# Patient Record
Sex: Female | Born: 1950
Health system: Southern US, Community
[De-identification: ages and names within clinical notes are randomized; demographics above are authoritative.]

## PROBLEM LIST (undated history)

## (undated) DIAGNOSIS — E785 Hyperlipidemia, unspecified: Secondary | ICD-10-CM

## (undated) DIAGNOSIS — T7840XA Allergy, unspecified, initial encounter: Secondary | ICD-10-CM

## (undated) DIAGNOSIS — J45909 Unspecified asthma, uncomplicated: Secondary | ICD-10-CM

## (undated) DIAGNOSIS — M1990 Unspecified osteoarthritis, unspecified site: Secondary | ICD-10-CM

## (undated) DIAGNOSIS — H269 Unspecified cataract: Secondary | ICD-10-CM

## (undated) DIAGNOSIS — K219 Gastro-esophageal reflux disease without esophagitis: Secondary | ICD-10-CM

## (undated) DIAGNOSIS — D649 Anemia, unspecified: Secondary | ICD-10-CM

## (undated) HISTORY — DX: Hyperlipidemia, unspecified: E78.5

## (undated) HISTORY — PX: OTHER SURGICAL HISTORY: SHX169

## (undated) HISTORY — PX: EYE SURGERY: SHX253

## (undated) HISTORY — DX: Allergy, unspecified, initial encounter: T78.40XA

## (undated) HISTORY — DX: Gastro-esophageal reflux disease without esophagitis: K21.9

## (undated) HISTORY — PX: TUBAL LIGATION: SHX77

## (undated) HISTORY — DX: Unspecified cataract: H26.9

## (undated) HISTORY — PX: FRACTURE SURGERY: SHX138

## (undated) HISTORY — DX: Unspecified osteoarthritis, unspecified site: M19.90

## (undated) HISTORY — PX: POLYPECTOMY: SHX149

## (undated) HISTORY — DX: Anemia, unspecified: D64.9

## (undated) HISTORY — DX: Unspecified asthma, uncomplicated: J45.909

## (undated) HISTORY — PX: ABDOMINAL HYSTERECTOMY: SHX81

## (undated) HISTORY — PX: COLONOSCOPY: SHX174

---

## 2001-01-08 ENCOUNTER — Encounter: Admission: RE | Admit: 2001-01-08 | Discharge: 2001-01-08 | Payer: Self-pay | Admitting: Gynecology

## 2001-01-08 ENCOUNTER — Encounter: Payer: Self-pay | Admitting: Gynecology

## 2001-01-10 ENCOUNTER — Other Ambulatory Visit: Admission: RE | Admit: 2001-01-10 | Discharge: 2001-01-10 | Payer: Self-pay | Admitting: Gynecology

## 2002-01-21 ENCOUNTER — Encounter: Payer: Self-pay | Admitting: Gynecology

## 2002-01-21 ENCOUNTER — Encounter: Admission: RE | Admit: 2002-01-21 | Discharge: 2002-01-21 | Payer: Self-pay | Admitting: Gynecology

## 2002-02-17 ENCOUNTER — Other Ambulatory Visit: Admission: RE | Admit: 2002-02-17 | Discharge: 2002-02-17 | Payer: Self-pay | Admitting: Gynecology

## 2003-02-19 ENCOUNTER — Other Ambulatory Visit: Admission: RE | Admit: 2003-02-19 | Discharge: 2003-02-19 | Payer: Self-pay | Admitting: Gynecology

## 2003-02-19 ENCOUNTER — Encounter: Payer: Self-pay | Admitting: Gynecology

## 2003-02-19 ENCOUNTER — Encounter: Admission: RE | Admit: 2003-02-19 | Discharge: 2003-02-19 | Payer: Self-pay | Admitting: Gynecology

## 2003-11-12 ENCOUNTER — Emergency Department (HOSPITAL_COMMUNITY): Admission: EM | Admit: 2003-11-12 | Discharge: 2003-11-13 | Payer: Self-pay | Admitting: Emergency Medicine

## 2004-02-29 ENCOUNTER — Encounter: Admission: RE | Admit: 2004-02-29 | Discharge: 2004-02-29 | Payer: Self-pay | Admitting: Gynecology

## 2004-02-29 ENCOUNTER — Other Ambulatory Visit: Admission: RE | Admit: 2004-02-29 | Discharge: 2004-02-29 | Payer: Self-pay | Admitting: Gynecology

## 2004-06-13 ENCOUNTER — Ambulatory Visit: Payer: Self-pay | Admitting: Family Medicine

## 2004-08-03 ENCOUNTER — Ambulatory Visit: Payer: Self-pay | Admitting: Family Medicine

## 2004-08-23 ENCOUNTER — Ambulatory Visit: Payer: Self-pay | Admitting: Family Medicine

## 2005-02-28 ENCOUNTER — Ambulatory Visit: Payer: Self-pay | Admitting: Internal Medicine

## 2005-03-01 ENCOUNTER — Other Ambulatory Visit: Admission: RE | Admit: 2005-03-01 | Discharge: 2005-03-01 | Payer: Self-pay | Admitting: Gynecology

## 2005-03-09 ENCOUNTER — Encounter: Admission: RE | Admit: 2005-03-09 | Discharge: 2005-03-09 | Payer: Self-pay | Admitting: Gynecology

## 2005-04-04 ENCOUNTER — Ambulatory Visit: Payer: Self-pay | Admitting: Internal Medicine

## 2005-06-07 ENCOUNTER — Ambulatory Visit: Payer: Self-pay | Admitting: Family Medicine

## 2006-03-05 ENCOUNTER — Other Ambulatory Visit: Admission: RE | Admit: 2006-03-05 | Discharge: 2006-03-05 | Payer: Self-pay | Admitting: Gynecology

## 2006-04-17 ENCOUNTER — Encounter: Admission: RE | Admit: 2006-04-17 | Discharge: 2006-04-17 | Payer: Self-pay | Admitting: Gynecology

## 2006-04-24 ENCOUNTER — Encounter: Admission: RE | Admit: 2006-04-24 | Discharge: 2006-04-24 | Payer: Self-pay | Admitting: Gynecology

## 2006-07-02 ENCOUNTER — Ambulatory Visit: Payer: Self-pay | Admitting: Family Medicine

## 2007-06-06 ENCOUNTER — Ambulatory Visit: Payer: Self-pay | Admitting: Family Medicine

## 2007-06-14 ENCOUNTER — Ambulatory Visit: Payer: Self-pay | Admitting: Family Medicine

## 2007-06-27 ENCOUNTER — Telehealth (INDEPENDENT_AMBULATORY_CARE_PROVIDER_SITE_OTHER): Payer: Self-pay | Admitting: *Deleted

## 2007-07-02 ENCOUNTER — Other Ambulatory Visit: Admission: RE | Admit: 2007-07-02 | Discharge: 2007-07-02 | Payer: Self-pay | Admitting: Gynecology

## 2007-07-02 ENCOUNTER — Encounter: Payer: Self-pay | Admitting: Family Medicine

## 2007-07-08 ENCOUNTER — Encounter: Payer: Self-pay | Admitting: Family Medicine

## 2007-07-10 ENCOUNTER — Encounter: Payer: Self-pay | Admitting: Family Medicine

## 2007-07-10 ENCOUNTER — Encounter: Admission: RE | Admit: 2007-07-10 | Discharge: 2007-07-10 | Payer: Self-pay | Admitting: Gynecology

## 2007-07-10 DIAGNOSIS — M899 Disorder of bone, unspecified: Secondary | ICD-10-CM | POA: Insufficient documentation

## 2007-07-10 DIAGNOSIS — M949 Disorder of cartilage, unspecified: Secondary | ICD-10-CM

## 2007-07-23 ENCOUNTER — Encounter: Admission: RE | Admit: 2007-07-23 | Discharge: 2007-07-23 | Payer: Self-pay | Admitting: Gynecology

## 2008-01-06 ENCOUNTER — Encounter: Payer: Self-pay | Admitting: Family Medicine

## 2008-05-08 ENCOUNTER — Encounter: Payer: Self-pay | Admitting: Family Medicine

## 2008-11-09 ENCOUNTER — Encounter: Payer: Self-pay | Admitting: Family Medicine

## 2009-01-12 ENCOUNTER — Encounter: Admission: RE | Admit: 2009-01-12 | Discharge: 2009-01-12 | Payer: Self-pay | Admitting: Gynecology

## 2009-01-15 ENCOUNTER — Encounter: Payer: Self-pay | Admitting: Family Medicine

## 2009-06-29 ENCOUNTER — Encounter: Payer: Self-pay | Admitting: Family Medicine

## 2009-10-27 ENCOUNTER — Encounter: Payer: Self-pay | Admitting: Family Medicine

## 2010-02-09 ENCOUNTER — Encounter (INDEPENDENT_AMBULATORY_CARE_PROVIDER_SITE_OTHER): Payer: Self-pay | Admitting: *Deleted

## 2010-03-24 ENCOUNTER — Encounter: Payer: Self-pay | Admitting: Family Medicine

## 2010-06-08 ENCOUNTER — Encounter: Payer: Self-pay | Admitting: Family Medicine

## 2010-08-28 ENCOUNTER — Encounter: Payer: Self-pay | Admitting: Gynecology

## 2010-09-06 NOTE — Letter (Signed)
Summary: Colonoscopy Letter   Gastroenterology  [address] [place], [place] [address]   Phone: [phone]  Fax: [phone]      February 09, 2010 MRN: [id number]   Melinda Tran [address] [place], [place]  [address]   Dear Melinda Tran,   According to your medical record, it is time for you to schedule a Colonoscopy. The American Cancer Society recommends this procedure as a method to detect early colon cancer. Patients with a family history of colon cancer, or a personal history of colon polyps or inflammatory bowel disease are at increased risk.  This letter has beeen generated based on the recommendations made at the time of your procedure. If you feel that in your particular situation this may no longer apply, please contact our office.  Please call our office at [phone] to schedule this appointment or to update your records at your earliest convenience.  Thank you for cooperating with [place] to provide you with the very best care possible.   Sincerely,  Hedwig Morton. Juanda Chance, M.D.  [facility] Gastroenterology Division [phone]

## 2010-09-06 NOTE — Consult Note (Signed)
Summary: Dr.Charles Lomax  Dr.Charles Lomax   Imported By: Beau Fanny 03/29/2010 08:51:50  _____________________________________________________________________  External Attachment:    Type:   Image     Comment:   External Document

## 2010-09-06 NOTE — Letter (Signed)
Summary: [facility]  [facility]   Imported By: Maryln Gottron 11/10/2009 14:21:05  _____________________________________________________________________  External Attachment:    Type:   Image     Comment:   External Document

## 2010-09-06 NOTE — Letter (Signed)
Summary: [facility]  [facility]   Imported By: Lanelle Bal 06/24/2010 14:12:35  _____________________________________________________________________  External Attachment:    Type:   Image     Comment:   External Document

## 2012-03-25 ENCOUNTER — Telehealth: Payer: Self-pay | Admitting: Internal Medicine

## 2012-03-25 ENCOUNTER — Encounter: Payer: Self-pay | Admitting: Internal Medicine

## 2012-03-25 NOTE — Telephone Encounter (Signed)
Recall Project: no contact with pt, letter mailed °

## 2014-01-28 ENCOUNTER — Other Ambulatory Visit: Payer: Self-pay

## 2014-01-28 DIAGNOSIS — Z1231 Encounter for screening mammogram for malignant neoplasm of breast: Secondary | ICD-10-CM

## 2014-02-05 ENCOUNTER — Ambulatory Visit
Admission: RE | Admit: 2014-02-05 | Discharge: 2014-02-05 | Disposition: A | Discharge status: Home / Self Care | Payer: BC Managed Care – PPO | Source: Ambulatory Visit

## 2014-02-05 ENCOUNTER — Encounter (INDEPENDENT_AMBULATORY_CARE_PROVIDER_SITE_OTHER): Payer: Self-pay

## 2014-02-05 DIAGNOSIS — Z1231 Encounter for screening mammogram for malignant neoplasm of breast: Secondary | ICD-10-CM

## 2014-02-05 IMAGING — MG MM SCREENING BREAST TOMO BILATERAL
8 of 12 series · 8 of 28 positions shown · non-contrast
Comparison: Previous exam(s).

CLINICAL DATA: Screening.

EXAM:
DIGITAL SCREENING BILATERAL MAMMOGRAM WITH 3D TOMO WITH CAD

[R CC synth-2D]
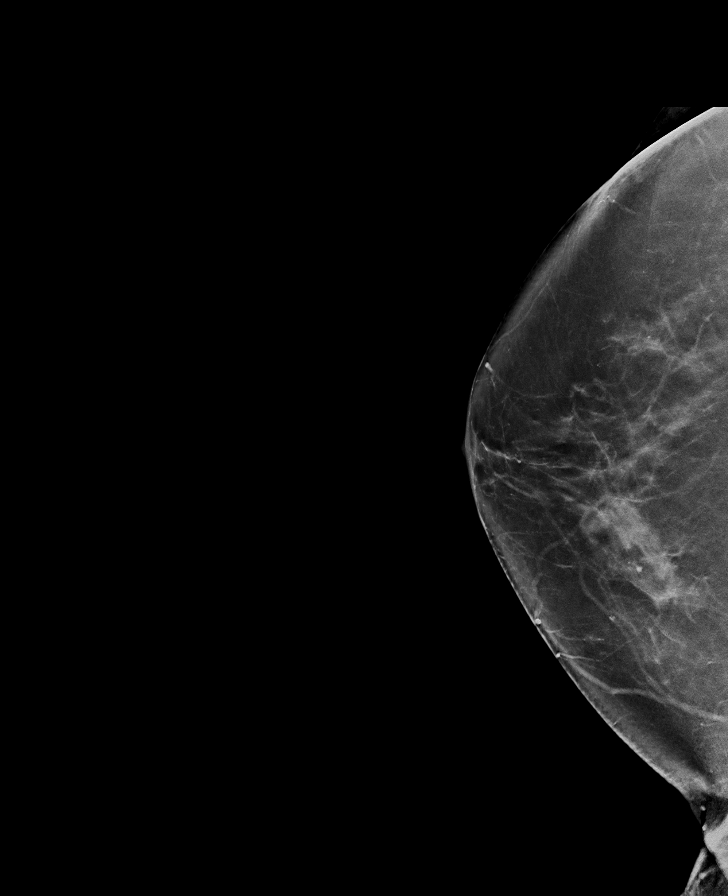

[L MLO synth-2D]
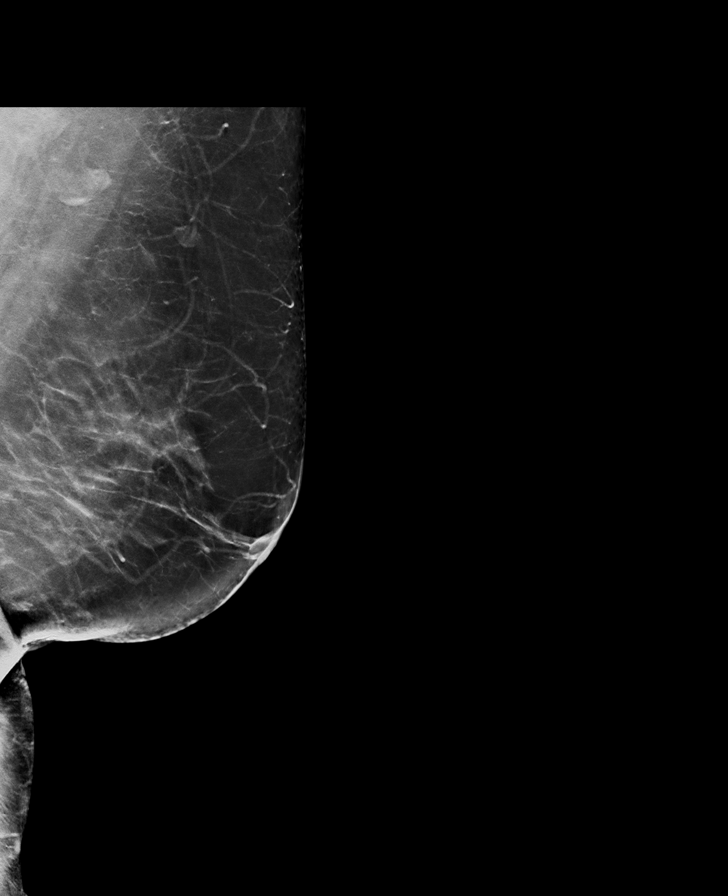

[R MLO synth-2D]
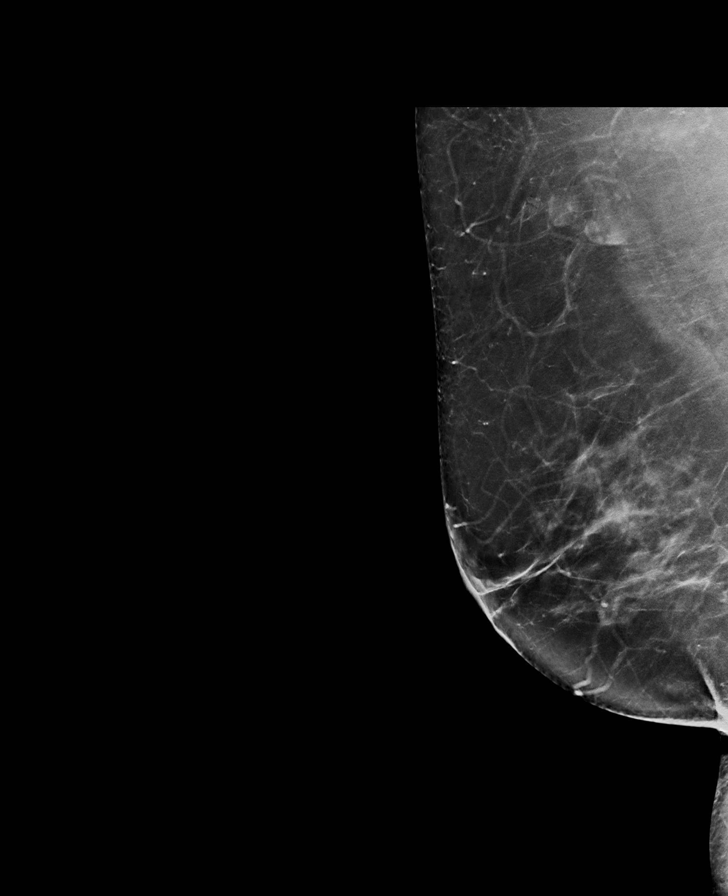

[L CC]
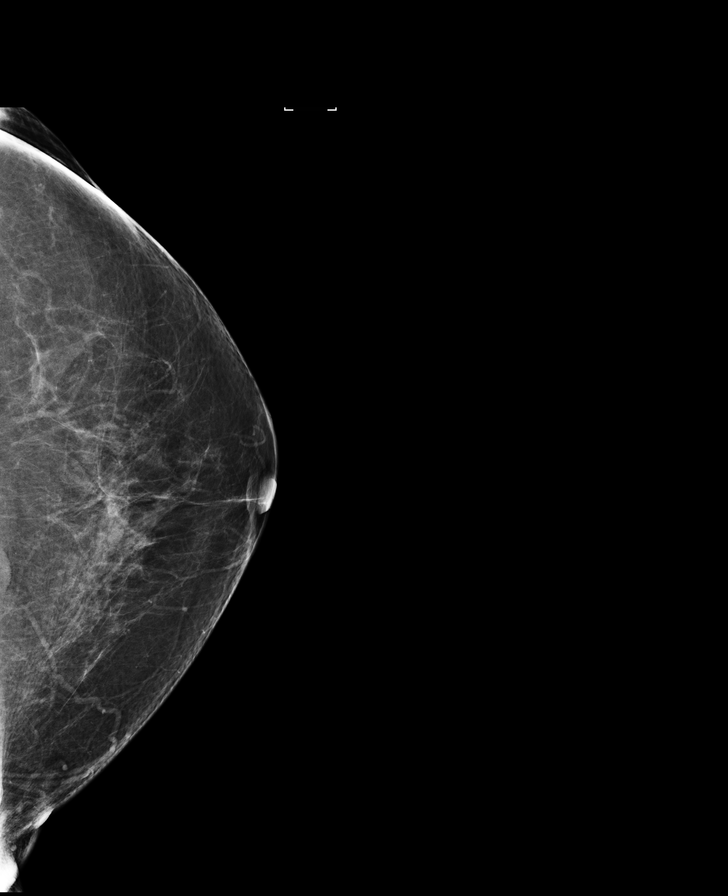

[R MLO]
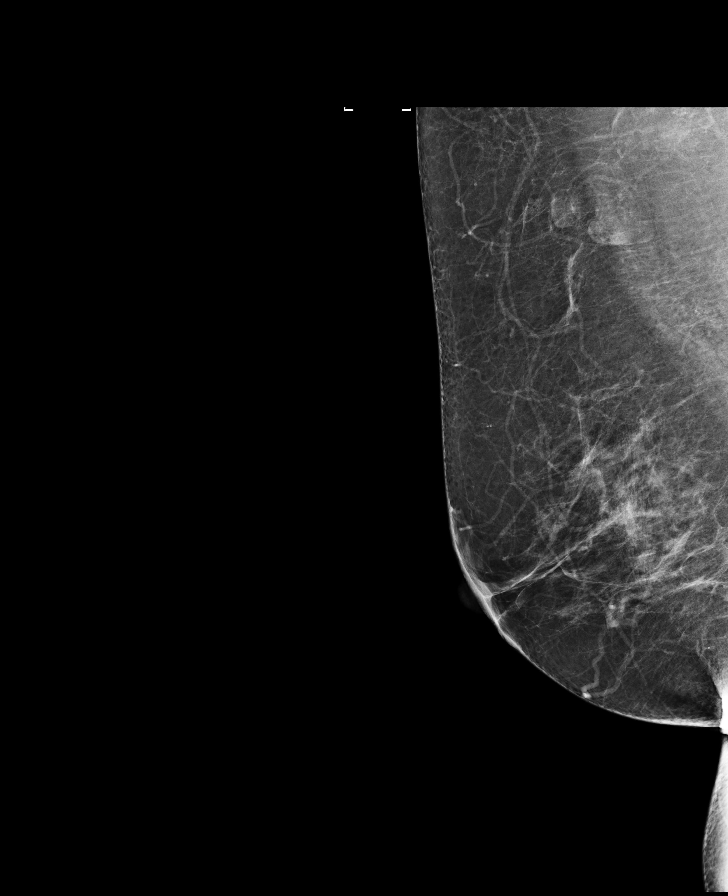

[R CC]
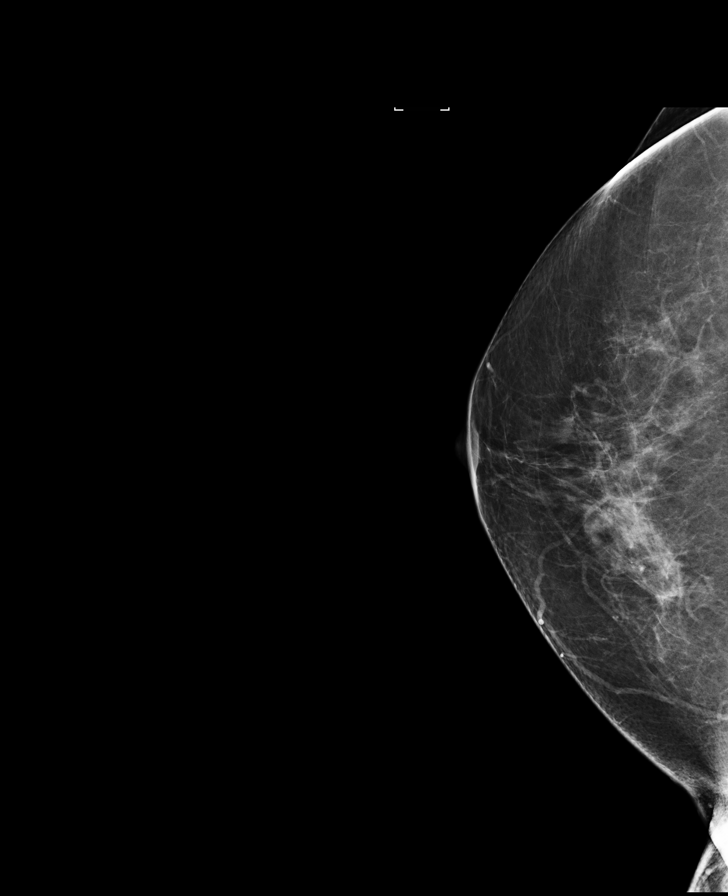

[L MLO]
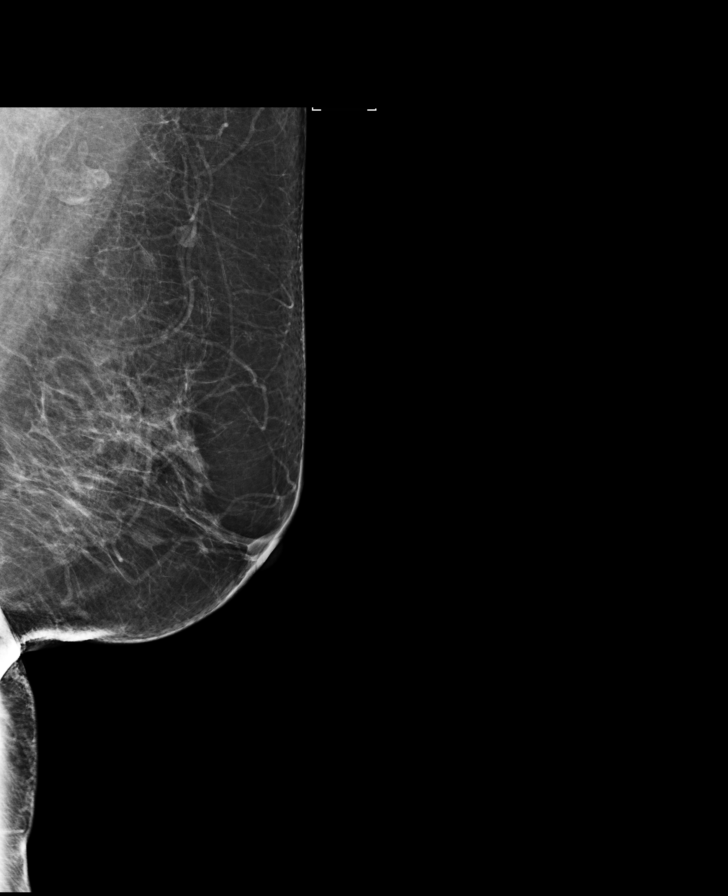

[L CC synth-2D]
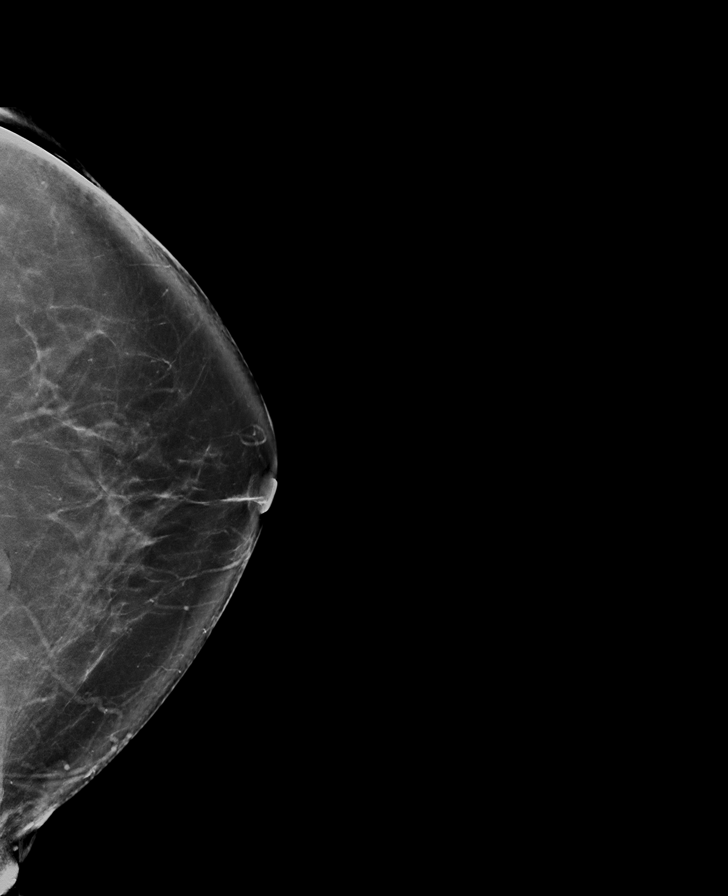

[8 of 28 positions shown; findings below may reference images not displayed]

ACR Breast Density Category b: There are scattered areas of
fibroglandular density.
FINDINGS: There have been no significant interval changes there are no
findings suspicious for malignancy. Images were processed with CAD.
IMPRESSION: No mammographic evidence of malignancy. A result letter of this
screening mammogram will be mailed directly to the patient.

RECOMMENDATION:
Screening mammogram in one year. (Code:[68])

BI-RADS CATEGORY  1: Negative.

## 2014-04-16 ENCOUNTER — Encounter (INDEPENDENT_AMBULATORY_CARE_PROVIDER_SITE_OTHER): Payer: Self-pay

## 2014-04-16 ENCOUNTER — Encounter: Payer: Self-pay | Admitting: Family Medicine

## 2014-04-16 ENCOUNTER — Ambulatory Visit (INDEPENDENT_AMBULATORY_CARE_PROVIDER_SITE_OTHER): Payer: BC Managed Care – PPO | Admitting: Family Medicine

## 2014-04-16 ENCOUNTER — Other Ambulatory Visit (HOSPITAL_COMMUNITY)
Admission: RE | Admit: 2014-04-16 | Discharge: 2014-04-16 | Disposition: A | Discharge status: Home / Self Care | Payer: BC Managed Care – PPO | Source: Ambulatory Visit | Attending: Family Medicine | Admitting: Family Medicine

## 2014-04-16 VITALS — BP 132/82 | HR 72 | Temp 98.2°F | Ht 65.5 in | Wt 194.0 lb

## 2014-04-16 DIAGNOSIS — Z800 Family history of malignant neoplasm of digestive organs: Secondary | ICD-10-CM

## 2014-04-16 DIAGNOSIS — H6123 Impacted cerumen, bilateral: Secondary | ICD-10-CM

## 2014-04-16 DIAGNOSIS — E785 Hyperlipidemia, unspecified: Secondary | ICD-10-CM

## 2014-04-16 DIAGNOSIS — Z01419 Encounter for gynecological examination (general) (routine) without abnormal findings: Secondary | ICD-10-CM | POA: Diagnosis present

## 2014-04-16 DIAGNOSIS — Z1151 Encounter for screening for human papillomavirus (HPV): Secondary | ICD-10-CM | POA: Insufficient documentation

## 2014-04-16 DIAGNOSIS — Z0000 Encounter for general adult medical examination without abnormal findings: Secondary | ICD-10-CM | POA: Insufficient documentation

## 2014-04-16 DIAGNOSIS — Z780 Asymptomatic menopausal state: Secondary | ICD-10-CM

## 2014-04-16 DIAGNOSIS — R42 Dizziness and giddiness: Secondary | ICD-10-CM | POA: Insufficient documentation

## 2014-04-16 DIAGNOSIS — J45909 Unspecified asthma, uncomplicated: Secondary | ICD-10-CM | POA: Insufficient documentation

## 2014-04-16 DIAGNOSIS — Z136 Encounter for screening for cardiovascular disorders: Secondary | ICD-10-CM

## 2014-04-16 DIAGNOSIS — Z2911 Encounter for prophylactic immunotherapy for respiratory syncytial virus (RSV): Secondary | ICD-10-CM

## 2014-04-16 DIAGNOSIS — N951 Menopausal and female climacteric states: Secondary | ICD-10-CM

## 2014-04-16 DIAGNOSIS — Z1211 Encounter for screening for malignant neoplasm of colon: Secondary | ICD-10-CM

## 2014-04-16 DIAGNOSIS — H6120 Impacted cerumen, unspecified ear: Secondary | ICD-10-CM | POA: Insufficient documentation

## 2014-04-16 LAB — COMPREHENSIVE METABOLIC PANEL
ALK PHOS: 89 U/L (ref 39–117)
ALT: 37 U/L — ABNORMAL HIGH (ref 0–35)
AST: 27 U/L (ref 0–37)
Albumin: 4.3 g/dL (ref 3.5–5.2)
BUN: 16 mg/dL (ref 6–23)
CO2: 27 mEq/L (ref 19–32)
Calcium: 9.9 mg/dL (ref 8.4–10.5)
Chloride: 106 mEq/L (ref 96–112)
Creatinine, Ser: 0.9 mg/dL (ref 0.4–1.2)
GFR: 68.98 mL/min (ref >60.00)
Glucose, Bld: 92 mg/dL (ref 70–99)
Potassium: 4.1 mEq/L (ref 3.5–5.1)
Sodium: 140 mEq/L (ref 135–145)
Total Bilirubin: 0.3 mg/dL (ref 0.2–1.2)
Total Protein: 7.6 g/dL (ref 6.0–8.3)

## 2014-04-16 LAB — CBC WITH DIFFERENTIAL/PLATELET
BASOS PCT: 0.8 % (ref 0.0–3.0)
Basophils Absolute: 0.1 10*3/uL (ref 0.0–0.1)
EOS ABS: 0.2 10*3/uL (ref 0.0–0.7)
EOS PCT: 2 % (ref 0.0–5.0)
HEMATOCRIT: 40.9 % (ref 36.0–46.0)
HEMOGLOBIN: 13.9 g/dL (ref 12.0–15.0)
Lymphocytes Relative: 27.3 % (ref 12.0–46.0)
Lymphs Abs: 2.3 10*3/uL (ref 0.7–4.0)
MCHC: 33.9 g/dL (ref 30.0–36.0)
MCV: 94 fl (ref 78.0–100.0)
Monocytes Absolute: 0.6 10*3/uL (ref 0.1–1.0)
Monocytes Relative: 6.5 % (ref 3.0–12.0)
Neutro Abs: 5.4 10*3/uL (ref 1.4–7.7)
Neutrophils Relative %: 63.4 % (ref 43.0–77.0)
PLATELETS: 352 10*3/uL (ref 150.0–400.0)
RBC: 4.35 Mil/uL (ref 3.87–5.11)
RDW: 13.2 % (ref 11.5–15.5)
WBC: 8.6 10*3/uL (ref 4.0–10.5)

## 2014-04-16 LAB — LIPID PANEL
Cholesterol: 252 mg/dL — ABNORMAL HIGH (ref 0–200)
HDL: 45 mg/dL (ref >39.00)
NONHDL: 207
Total CHOL/HDL Ratio: 6
Triglycerides: 244 mg/dL — ABNORMAL HIGH (ref 0.0–149.0)
VLDL: 48.8 mg/dL — AB (ref 0.0–40.0)

## 2014-04-16 LAB — LDL CHOLESTEROL, DIRECT: LDL DIRECT: 199.8 mg/dL

## 2014-04-16 LAB — TSH: TSH: 1.2 u[IU]/mL (ref 0.35–4.50)

## 2014-04-16 MED ORDER — ESTROGENS, CONJUGATED 0.625 MG/GM VA CREA: 1.0000 | TOPICAL_CREAM | Freq: Every day | VAGINAL | Status: DC

## 2014-04-16 NOTE — Patient Instructions (Signed)
It was nice to meet you. We will call you with your lab results from today.  Use dilute hydrogen peroxide (1:1 with water) a few drops in right ear to help break down ear wax.

## 2014-04-16 NOTE — Assessment & Plan Note (Signed)
Does not have a cervix. Pap done today but does not need further pap smears. Discussed this with pt today.

## 2014-04-16 NOTE — Assessment & Plan Note (Signed)
New- intermittent. Symptoms consistent with BPV. Likely due to cerumen impaction. See below.

## 2014-04-16 NOTE — Assessment & Plan Note (Signed)
New- Ceruminosis is noted.  Wax is removed by syringing and manual debridement. Instructions for home care to prevent wax buildup are given.

## 2014-04-16 NOTE — Addendum Note (Signed)
Addended by: Despina Hidden on: 04/16/2014 12:10 PM   Modules accepted: Orders

## 2014-04-16 NOTE — Assessment & Plan Note (Signed)
Symptoms well controlled. Followed by Dr. Donneta Romberg.

## 2014-04-16 NOTE — Assessment & Plan Note (Signed)
Refer to GI for colonoscopy- due for screening.

## 2014-04-16 NOTE — Assessment & Plan Note (Signed)
Reviewed preventive care protocols, scheduled due services, and updated immunizations Discussed nutrition, exercise, diet, and healthy lifestyle.  Influenza and zoster vaccines today. Orders Placed This Encounter  Procedures  . CBC with Differential  . Comprehensive metabolic panel  . Lipid panel  . TSH  . Ambulatory referral to Gastroenterology

## 2014-04-16 NOTE — Progress Notes (Signed)
Pre visit review using our clinic review tool, if applicable. No additional management support is needed unless otherwise documented below in the visit note. 

## 2014-04-16 NOTE — Progress Notes (Signed)
Subjective:   Patient ID: Melinda Tran, female    DOB: Mar 14, 1951, 63 y.o.   MRN: [id number]  Melinda Tran is a pleasant 63 y.o. year old female who presents to clinic today with [place]  on 04/16/2014  HPI: She would like a CPX today.  Due for colonoscopy- last colonoscopy in 2005- Dr. Olevia Perches. Mom did have colon CA in her 41s.  Denies changes in her bowel habits or blood in her stool.  S/p hysterectomy but she is unsure if she still has a cervix.  She would like premarin cream- GYN was prescribing this for vaginal dryness and incontinence.  Mammogram UTD- 02/05/14.  Unsure when she last had lab work done.  Has allergic asthma- followed by Dr. Donneta Romberg.  Receives weekly allergy shots.  Also sues Dymista, [place], albuterol and Zyrtec. 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  No recent flares.  Dizziness- intermittent vertigo symptoms-  Most recently had symptoms last week.  Usually occurs when her right ear is hurting or full. No other URI symptoms.  Symptoms worse when she changes head position or moves quickly.  No vomiting. No fevers.  No current outpatient prescriptions on file prior to visit.   No current facility-administered medications on file prior to visit.    No Known Allergies  No past medical history on file.  No past surgical history on file.  No family history on file.  History   Social History  . Marital Status: Married    Spouse Name: N/A    Number of Children: N/A  . Years of Education: N/A   Occupational History  . Not on file.   Social History Main Topics  . Smoking status: Never Smoker   . Smokeless tobacco: Never Used  . Alcohol Use: Yes  . Drug Use: No  . Sexual Activity: Not on file   Other Topics Concern  . Not on file   Social History Narrative  . No narrative on file   The PMH, PSH, Social History, Family History, Medications, and allergies have been reviewed in [facility], and have been updated if  relevant.    Review of Systems See HPI Patient reports no  vision/ hearing changes,anorexia, weight change, fever ,adenopathy, persistant / recurrent hoarseness, swallowing issues, chest pain, edema,persistant / recurrent cough, hemoptysis, dyspnea(rest, exertional, paroxysmal nocturnal), gastrointestinal  bleeding (melena, rectal bleeding), abdominal pain, excessive heart burn, GU symptoms(dysuria, hematuria, pyuria, voiding/incontinence  Issues) syncope, focal weakness, severe memory loss, concerning skin lesions, depression, anxiety, abnormal bruising/bleeding, major joint swelling, breast masses or abnormal vaginal bleeding.       Objective:    BP 132/82  Pulse 72  Temp(Src) 98.2 F (36.8 C) (Tympanic)  Ht 5' 5.5" (1.664 m)  Wt 194 lb (87.998 kg)  BMI 31.78 kg/m2  SpO2 97%   Physical Exam   General:  Well-developed,well-nourished,in no acute distress; alert,appropriate and cooperative throughout examination Head:  normocephalic and atraumatic.   Eyes:  vision grossly intact, pupils equal, pupils round, and pupils reactive to light.   Ears:  Cerumen impaction bilaterally, TMs clear once cerumen removed Dix hallpike neg- no nyastagmus Nose:  no external deformity.   Mouth:  good dentition.   Neck:  No deformities, masses, or tenderness noted. Breasts:  No mass, nodules, thickening, tenderness, bulging, retraction, inflamation, nipple discharge or skin changes noted.   Lungs:  Normal respiratory effort, chest expands symmetrically. Lungs are clear to auscultation, no crackles or wheezes. Heart:  Normal rate and regular rhythm.  S1 and S2 normal without gallop, murmur, click, rub or other extra sounds. Abdomen:  Bowel sounds positive,abdomen soft and non-tender without masses, organomegaly or hernias noted. Rectal:  no external abnormalities.   Genitalia:  Pelvic Exam:        External: normal female genitalia without lesions or masses        Vagina: normal without lesions or  masses        Cervix: absent        Adnexa: normal bimanual exam without masses or fullness        Uterus: absent        Pap smear: performed Msk:  No deformity or scoliosis noted of thoracic or lumbar spine.   Extremities:  No clubbing, cyanosis, edema, or deformity noted with normal full range of motion of all joints.   Neurologic:  alert & oriented X3 and gait normal.   Skin:  Intact without suspicious lesions or rashes Cervical Nodes:  No lymphadenopathy noted Axillary Nodes:  No palpable lymphadenopathy Psych:  Cognition and judgment appear intact. Alert and cooperative with normal attention span and concentration. No apparent delusions, illusions, hallucinations       Assessment & Plan:   Dizziness and giddiness  Allergic asthma, unspecified asthma severity, uncomplicated  Family history of colon cancer in mother - Plan: Ambulatory referral to Gastroenterology  Special screening for malignant neoplasms, colon - Plan: Ambulatory referral to Gastroenterology  Routine general medical examination at a health care facility - Plan: CBC with Differential, Comprehensive metabolic panel, TSH  Screening for ischemic heart disease - Plan: Lipid panel  Post menopausal syndrome No Follow-up on file.

## 2014-04-16 NOTE — Assessment & Plan Note (Signed)
Deteriorated. >25 min spent with face to face with patient, >50% counseling and/or coordinating care Discussed risks and benefits of HRT.  Pt does not have a uterus and does not need progesterone with estrogen to prevent endometrial hyperplasia. Premarin cream rx sent.

## 2014-04-20 ENCOUNTER — Encounter: Payer: Self-pay | Admitting: *Deleted

## 2014-04-20 LAB — CYTOLOGY - PAP

## 2014-04-23 ENCOUNTER — Encounter: Payer: Self-pay | Admitting: *Deleted

## 2014-04-23 MED ORDER — ATORVASTATIN CALCIUM 20 MG PO TABS: 20.0000 mg | ORAL_TABLET | Freq: Every day | ORAL | Status: DC

## 2014-04-23 NOTE — Addendum Note (Signed)
Addended by: Modena Nunnery on: 04/23/2014 01:26 PM   Modules accepted: Orders

## 2014-06-11 ENCOUNTER — Encounter: Payer: Self-pay | Admitting: Family Medicine

## 2014-06-11 ENCOUNTER — Ambulatory Visit (INDEPENDENT_AMBULATORY_CARE_PROVIDER_SITE_OTHER): Payer: BC Managed Care – PPO | Admitting: Family Medicine

## 2014-06-11 VITALS — BP 116/64 | HR 60 | Temp 98.2°F | Wt 193.8 lb

## 2014-06-11 DIAGNOSIS — E785 Hyperlipidemia, unspecified: Secondary | ICD-10-CM

## 2014-06-11 LAB — COMPREHENSIVE METABOLIC PANEL
ALBUMIN: 3.8 g/dL (ref 3.5–5.2)
ALT: 25 U/L (ref 0–35)
AST: 18 U/L (ref 0–37)
Alkaline Phosphatase: 78 U/L (ref 39–117)
BILIRUBIN TOTAL: 0.6 mg/dL (ref 0.2–1.2)
BUN: 13 mg/dL (ref 6–23)
CHLORIDE: 104 meq/L (ref 96–112)
CO2: 26 mEq/L (ref 19–32)
CREATININE: 0.9 mg/dL (ref 0.4–1.2)
Calcium: 9.6 mg/dL (ref 8.4–10.5)
GFR: 70.8 mL/min (ref >60.00)
Glucose, Bld: 104 mg/dL — ABNORMAL HIGH (ref 70–99)
POTASSIUM: 3.7 meq/L (ref 3.5–5.1)
Sodium: 139 mEq/L (ref 135–145)
Total Protein: 7.4 g/dL (ref 6.0–8.3)

## 2014-06-11 LAB — LIPID PANEL
CHOL/HDL RATIO: 5
CHOLESTEROL: 170 mg/dL (ref 0–200)
HDL: 37.1 mg/dL — AB (ref >39.00)
LDL CALC: 108 mg/dL — AB (ref 0–99)
NonHDL: 132.9
TRIGLYCERIDES: 124 mg/dL (ref 0.0–149.0)
VLDL: 24.8 mg/dL (ref 0.0–40.0)

## 2014-06-11 NOTE — Progress Notes (Signed)
Pre visit review using our clinic review tool, if applicable. No additional management support is needed unless otherwise documented below in the visit note. 

## 2014-06-11 NOTE — Patient Instructions (Signed)
Great to see you. Please continue working on your diet and taking your cholesterol medication as directed. We will contact you with your lab results from today.

## 2014-06-11 NOTE — Progress Notes (Signed)
Patient ID: Melinda Tran, female   DOB: Dec 16, 1950, 63 y.o.   MRN: [id number]   Subjective:   Patient ID: Melinda Tran, female    DOB: 1950-11-11, 63 y.o.   MRN: [id number]  Melinda Tran is a pleasant 63 y.o. year old female who presents to clinic today with Follow-up  on 06/11/2014  HPI: Established care with me in 04/2014.    CPX done at that visit, lipid panel found to be very high- Lab Results  Component Value Date   CHOL 252* 04/16/2014   HDL 45.00 04/16/2014   LDLDIRECT 199.8 04/16/2014   TRIG 244.0* 04/16/2014   CHOLHDL 6 04/16/2014   Started rx for lipitor 20 mg daily.  She has been taking this daily.  Denies myalgias. Has been working on her diet- cutting back on salad dressings and "comfort foods."  Current Outpatient Prescriptions on File Prior to Visit  Medication Sig Dispense Refill  . albuterol (PROVENTIL HFA;VENTOLIN HFA) 108 (90 BASE) MCG/ACT inhaler Inhale 2 puffs into the lungs as needed for wheezing or shortness of breath.    Marland Kitchen atorvastatin (LIPITOR) 20 MG tablet Take 1 tablet (20 mg total) by mouth daily. 90 tablet 3  . calcium carbonate 200 MG capsule Take 250 mg by mouth daily.    . cetirizine (ZYRTEC) 10 MG tablet Take 10 mg by mouth daily.    . cholecalciferol (VITAMIN D) 1000 UNITS tablet Take 1,000 Units by mouth daily.    Marland Kitchen conjugated estrogens (PREMARIN) vaginal cream Place 1 Applicatorful vaginally daily. 42.5 g 12  . DULERA 200-5 MCG/ACT AERO Inhale 2 puffs into the lungs daily.     Marland Kitchen DYMISTA 137-50 MCG/ACT SUSP Place 1 spray into both nostrils 2 (two) times daily.     Marland Kitchen esomeprazole (NEXIUM) 40 MG capsule Take 40 mg by mouth daily at 12 noon.    . Turmeric 500 MG CAPS Take by mouth daily.    . vitamin C (ASCORBIC ACID) 500 MG tablet Take 500 mg by mouth daily.     No current facility-administered medications on file prior to visit.    No Known Allergies  No past medical history on file.  No past surgical history on file.  No family history on  file.  History   Social History  . Marital Status: Married    Spouse Name: N/A    Number of Children: N/A  . Years of Education: N/A   Occupational History  . Not on file.   Social History Main Topics  . Smoking status: Former Research scientist (life sciences)  . Smokeless tobacco: Never Used  . Alcohol Use: 0.0 oz/week    0 Not specified per week  . Drug Use: No  . Sexual Activity: Not on file   Other Topics Concern  . Not on file   Social History Narrative   The PMH, PSH, Social History, Family History, Medications, and allergies have been reviewed in [facility], and have been updated if relevant.  Review of Systems    See HPI No fatigue No myalgias No CP or SOB Objective:    BP 116/64 mmHg  Pulse 60  Temp(Src) 98.2 F (36.8 C) (Oral)  Wt 193 lb 12 oz (87.884 kg)  SpO2 96%   Physical Exam  Constitutional: She is oriented to person, place, and time. She appears well-developed and well-nourished. No distress.  Neurological: She is alert and oriented to person, place, and time.  Skin: Skin is warm and dry.  Psychiatric: She has a normal  mood and affect. Her behavior is normal. Thought content normal.  Nursing note and vitals reviewed.         Assessment & Plan:   HLD (hyperlipidemia) - Plan: Lipid panel, Comprehensive metabolic panel No Follow-up on file.

## 2014-06-11 NOTE — Assessment & Plan Note (Signed)
New- tolerating simvastatin. Will recheck lipid panel today. The patient indicates understanding of these issues and agrees with the plan. Orders Placed This Encounter  Procedures  . Lipid panel  . Comprehensive metabolic panel

## 2014-06-12 ENCOUNTER — Encounter: Payer: Self-pay | Admitting: *Deleted

## 2014-06-23 ENCOUNTER — Encounter: Payer: Self-pay | Admitting: Internal Medicine

## 2014-09-09 ENCOUNTER — Ambulatory Visit (AMBULATORY_SURGERY_CENTER): Payer: Self-pay | Admitting: *Deleted

## 2014-09-09 VITALS — Ht 66.0 in | Wt 196.8 lb

## 2014-09-09 DIAGNOSIS — Z800 Family history of malignant neoplasm of digestive organs: Secondary | ICD-10-CM

## 2014-09-09 DIAGNOSIS — Z86010 Personal history of colonic polyps: Secondary | ICD-10-CM

## 2014-09-09 MED ORDER — MOVIPREP 100 G PO SOLR: 1.0000 | Freq: Once | ORAL | Status: DC

## 2014-09-09 NOTE — Progress Notes (Signed)
No egg or soy allergy No issues with past sedation No diet pills No home 02 use Declined emmi

## 2014-09-23 ENCOUNTER — Encounter: Payer: Self-pay | Admitting: Internal Medicine

## 2014-09-23 ENCOUNTER — Ambulatory Visit (AMBULATORY_SURGERY_CENTER): Payer: BC Managed Care – PPO | Admitting: Internal Medicine

## 2014-09-23 VITALS — BP 145/81 | HR 64 | Temp 98.1°F | Resp 17 | Ht 66.0 in | Wt 196.0 lb

## 2014-09-23 DIAGNOSIS — D122 Benign neoplasm of ascending colon: Secondary | ICD-10-CM

## 2014-09-23 DIAGNOSIS — Z800 Family history of malignant neoplasm of digestive organs: Secondary | ICD-10-CM

## 2014-09-23 DIAGNOSIS — Z1211 Encounter for screening for malignant neoplasm of colon: Secondary | ICD-10-CM

## 2014-09-23 DIAGNOSIS — Z860100 Personal history of colon polyps, unspecified: Secondary | ICD-10-CM

## 2014-09-23 MED ORDER — SODIUM CHLORIDE 0.9 % IV SOLN: 500.0000 mL | INTRAVENOUS | Status: DC

## 2014-09-23 NOTE — Patient Instructions (Signed)
YOU HAD AN ENDOSCOPIC PROCEDURE TODAY AT THE [facility]: Refer to the procedure report that was given to you for any specific questions about what was found during the examination.  If the procedure report does not answer your questions, please call your gastroenterologist to clarify.  If you requested that your care partner not be given the details of your procedure findings, then the procedure report has been included in a sealed envelope for you to review at your convenience later.  YOU SHOULD EXPECT: Some feelings of bloating in the abdomen. Passage of more gas than usual.  Walking can help get rid of the air that was put into your GI tract during the procedure and reduce the bloating. If you had a lower endoscopy (such as a colonoscopy or flexible sigmoidoscopy) you may notice spotting of blood in your stool or on the toilet paper. If you underwent a bowel prep for your procedure, then you may not have a normal bowel movement for a few days.  DIET: Your first meal following the procedure should be a light meal and then it is ok to progress to your normal diet.  A half-sandwich or bowl of soup is an example of a good first meal.  Heavy or fried foods are harder to digest and may make you feel nauseous or bloated.  Likewise meals heavy in dairy and vegetables can cause extra gas to form and this can also increase the bloating.  Drink plenty of fluids but you should avoid alcoholic beverages for 24 hours.  ACTIVITY: Your care partner should take you home directly after the procedure.  You should plan to take it easy, moving slowly for the rest of the day.  You can resume normal activity the day after the procedure however you should NOT DRIVE or use heavy machinery for 24 hours (because of the sedation medicines used during the test).    SYMPTOMS TO REPORT IMMEDIATELY: A gastroenterologist can be reached at any hour.  During normal business hours, 8:30 AM to 5:00 PM Monday through Friday,  call [phone].  After hours and on weekends, please call the GI answering service at [phone] who will take a message and have the physician on call contact you.   Following lower endoscopy (colonoscopy or flexible sigmoidoscopy):  Excessive amounts of blood in the stool  Significant tenderness or worsening of abdominal pains  Swelling of the abdomen that is new, acute  Fever of 100F or higher  FOLLOW UP: If any biopsies were taken you will be contacted by phone or by letter within the next 1-3 weeks.  Call your gastroenterologist if you have not heard about the biopsies in 3 weeks.  Our staff will call the home number listed on your records the next business day following your procedure to check on you and address any questions or concerns that you may have at that time regarding the information given to you following your procedure. This is a courtesy call and so if there is no answer at the home number and we have not heard from you through the emergency physician on call, we will assume that you have returned to your regular daily activities without incident.  SIGNATURES/CONFIDENTIALITY: You and/or your care partner have signed paperwork which will be entered into your electronic medical record.  These signatures attest to the fact that that the information above on your After Visit Summary has been reviewed and is understood.  Full responsibility of the confidentiality of this   discharge information lies with you and/or your care-partner.  Recommendations Discharge instructions given to patient and/or care partner. Diverticulosis, polyp, and high fiber diet handouts provided. Next colonoscopy determined by pathology results.

## 2014-09-23 NOTE — Progress Notes (Signed)
Called to room to assist during endoscopic procedure.  Patient ID and intended procedure confirmed with present staff. Received instructions for my participation in the procedure from the performing physician.  

## 2014-09-23 NOTE — Op Note (Signed)
[place]  [address] [place], [phone]   COLONOSCOPY PROCEDURE REPORT  PATIENT: Melinda Tran, Melinda Tran  MR#: [id number] BIRTHDATE: [birth year] , 63  yrs. old GENDER: female ENDOSCOPIST: Lafayette Dragon, MD REFERRED ZY:SAYTK Aron, M.D. PROCEDURE DATE:  09/23/2014 PROCEDURE:   Colonoscopy with snare polypectomy First Screening Colonoscopy - Avg.  risk and is 50 yrs.  old or older - No.  Prior Negative Screening - Now for repeat screening. 10 or more years since last screening  History of Adenoma - Now for follow-up colonoscopy & has been > or = to 3 yrs.  N/A  Polyps Removed Today? Yes. ASA CLASS:   Class II INDICATIONS:family history of colon cancer in patient's mother at age 40.  Last colonoscopy August 2006 and prior to that in 1997 when she had hyperplastic polyp removed. MEDICATIONS: Monitored anesthesia care and Propofol 300 mg IV  DESCRIPTION OF PROCEDURE:   After the risks benefits and alternatives of the procedure were thoroughly explained, informed consent was obtained.  The digital rectal exam revealed no abnormalities of the rectum.   The LB [id number]  endoscope was introduced through the anus and advanced to the cecum, which was identified by both the appendix and ileocecal valve. No adverse events experienced.   The quality of the prep was good, using MoviPrep  The instrument was then slowly withdrawn as the colon was fully examined.      COLON FINDINGS: A polypoid shaped sessile polyp measuring 20 mm in size was found in the ascending colon.  A polypectomy was performed with a cold snare.  The resection was complete, the polyp tissue was completely retrieved and sent to histology.   There was mild diverticulosis noted in the sigmoid colon.  Retroflexed views revealed no abnormalities. The time to cecum=2 minutes 55 seconds. Withdrawal time=10 minutes 01 seconds.  The scope was withdrawn and the procedure completed. COMPLICATIONS: There  were no immediate complications.  ENDOSCOPIC IMPRESSION: 1.   Sessile polyp was found in the ascending colon; polypectomy was performed with a cold snare 2.   Mild diverticulosis was noted in the sigmoid colon  RECOMMENDATIONS: 1.  Await pathology results 2.  High fiber diet Recall colonoscopy pending path report  eSigned:  Lafayette Dragon, MD 09/23/2014 9:16 AM   cc:   PATIENT NAME:  Melinda Tran, Melinda Tran MR#: [id number]

## 2014-09-23 NOTE — Progress Notes (Signed)
A/ox3, pleased with MAC, report to RN 

## 2014-09-24 ENCOUNTER — Telehealth: Payer: Self-pay | Admitting: *Deleted

## 2014-09-24 NOTE — Telephone Encounter (Signed)
  Follow up Call-  Call back number 09/23/2014  Post procedure Call Back phone  # [phone]  Permission to leave phone message Yes     Patient questions:  Message left to call us if necessary.

## 2014-09-28 ENCOUNTER — Encounter: Payer: Self-pay | Admitting: Internal Medicine

## 2015-05-04 ENCOUNTER — Other Ambulatory Visit: Payer: Self-pay | Admitting: *Deleted

## 2015-05-04 DIAGNOSIS — E785 Hyperlipidemia, unspecified: Secondary | ICD-10-CM

## 2015-05-04 MED ORDER — ATORVASTATIN CALCIUM 20 MG PO TABS: 20.0000 mg | ORAL_TABLET | Freq: Every day | ORAL | Status: DC

## 2015-05-27 ENCOUNTER — Ambulatory Visit (INDEPENDENT_AMBULATORY_CARE_PROVIDER_SITE_OTHER): Payer: BC Managed Care – PPO | Admitting: Family Medicine

## 2015-05-27 ENCOUNTER — Encounter: Payer: Self-pay | Admitting: Family Medicine

## 2015-05-27 VITALS — BP 136/78 | HR 70 | Temp 98.3°F | Wt 203.4 lb

## 2015-05-27 DIAGNOSIS — Z780 Asymptomatic menopausal state: Secondary | ICD-10-CM

## 2015-05-27 DIAGNOSIS — J45909 Unspecified asthma, uncomplicated: Secondary | ICD-10-CM

## 2015-05-27 DIAGNOSIS — Z114 Encounter for screening for human immunodeficiency virus [HIV]: Secondary | ICD-10-CM

## 2015-05-27 DIAGNOSIS — N951 Menopausal and female climacteric states: Secondary | ICD-10-CM

## 2015-05-27 DIAGNOSIS — K219 Gastro-esophageal reflux disease without esophagitis: Secondary | ICD-10-CM

## 2015-05-27 DIAGNOSIS — E785 Hyperlipidemia, unspecified: Secondary | ICD-10-CM | POA: Diagnosis not present

## 2015-05-27 DIAGNOSIS — Z23 Encounter for immunization: Secondary | ICD-10-CM | POA: Diagnosis not present

## 2015-05-27 DIAGNOSIS — Z1159 Encounter for screening for other viral diseases: Secondary | ICD-10-CM

## 2015-05-27 LAB — CBC WITH DIFFERENTIAL/PLATELET
BASOS ABS: 0 10*3/uL (ref 0.0–0.1)
BASOS PCT: 0.7 % (ref 0.0–3.0)
EOS ABS: 0.2 10*3/uL (ref 0.0–0.7)
Eosinophils Relative: 2.3 % (ref 0.0–5.0)
HEMATOCRIT: 40.1 % (ref 36.0–46.0)
HEMOGLOBIN: 13.5 g/dL (ref 12.0–15.0)
LYMPHS PCT: 29 % (ref 12.0–46.0)
Lymphs Abs: 1.9 10*3/uL (ref 0.7–4.0)
MCHC: 33.7 g/dL (ref 30.0–36.0)
MCV: 90.2 fl (ref 78.0–100.0)
Monocytes Absolute: 0.4 10*3/uL (ref 0.1–1.0)
Monocytes Relative: 6 % (ref 3.0–12.0)
Neutro Abs: 4.1 10*3/uL (ref 1.4–7.7)
Neutrophils Relative %: 62 % (ref 43.0–77.0)
Platelets: 305 10*3/uL (ref 150.0–400.0)
RBC: 4.44 Mil/uL (ref 3.87–5.11)
RDW: 13.2 % (ref 11.5–15.5)
WBC: 6.6 10*3/uL (ref 4.0–10.5)

## 2015-05-27 LAB — COMPREHENSIVE METABOLIC PANEL
ALBUMIN: 4.3 g/dL (ref 3.5–5.2)
ALK PHOS: 72 U/L (ref 39–117)
ALT: 33 U/L (ref 0–35)
AST: 21 U/L (ref 0–37)
BILIRUBIN TOTAL: 0.5 mg/dL (ref 0.2–1.2)
BUN: 13 mg/dL (ref 6–23)
CO2: 25 mEq/L (ref 19–32)
Calcium: 9.5 mg/dL (ref 8.4–10.5)
Chloride: 107 mEq/L (ref 96–112)
Creatinine, Ser: 0.75 mg/dL (ref 0.40–1.20)
GFR: 82.66 mL/min (ref >60.00)
Glucose, Bld: 153 mg/dL — ABNORMAL HIGH (ref 70–99)
Potassium: 3.6 mEq/L (ref 3.5–5.1)
Sodium: 141 mEq/L (ref 135–145)
TOTAL PROTEIN: 7.1 g/dL (ref 6.0–8.3)

## 2015-05-27 LAB — LIPID PANEL
CHOLESTEROL: 174 mg/dL (ref 0–200)
HDL: 43.7 mg/dL (ref >39.00)
LDL Cholesterol: 90 mg/dL (ref 0–99)
NonHDL: 129.9
TRIGLYCERIDES: 200 mg/dL — AB (ref 0.0–149.0)
Total CHOL/HDL Ratio: 4
VLDL: 40 mg/dL (ref 0.0–40.0)

## 2015-05-27 LAB — TSH: TSH: 2.14 u[IU]/mL (ref 0.35–4.50)

## 2015-05-27 MED ORDER — ESTROGENS, CONJUGATED 0.625 MG/GM VA CREA: 1.0000 | TOPICAL_CREAM | Freq: Every day | VAGINAL | Status: DC

## 2015-05-27 MED ORDER — ATORVASTATIN CALCIUM 20 MG PO TABS: 20.0000 mg | ORAL_TABLET | Freq: Every day | ORAL | Status: DC

## 2015-05-27 NOTE — Patient Instructions (Signed)
Great to see you. We will call you with your results from today. 

## 2015-05-27 NOTE — Progress Notes (Signed)
Pre visit review using our clinic review tool, if applicable. No additional management support is needed unless otherwise documented below in the visit note. 

## 2015-05-27 NOTE — Progress Notes (Signed)
Subjective:   Patient ID: Melinda Tran, female    DOB: 1950/10/18, 64 y.o.   MRN: [id number]  Melinda Tran is a pleasant 64 y.o. year old female who presents to clinic today with Follow-up  on 05/27/2015  HPI:  HLD- taking lipitor 20 mg daily. Denies myalgias. Lab Results  Component Value Date   CHOL 170 06/11/2014   HDL 37.10* 06/11/2014   LDLCALC 108* 06/11/2014   LDLDIRECT 199.8 04/16/2014   TRIG 124.0 06/11/2014   CHOLHDL 5 06/11/2014   Lab Results  Component Value Date   ALT 25 06/11/2014   AST 18 06/11/2014   ALKPHOS 78 06/11/2014   BILITOT 0.6 06/11/2014    Has allergic asthma- followed by Dr. Donneta Romberg.  Receiving allergy shots weekly.  Also sues Dymista, [place], albuterol and Zyrtec.  No recent flares.  GERD- takes Nexium 40 mg daily.  Symptoms well controlled.  Post menopausal symptoms- on HRT, remote h/o hysterectomy.  Current Outpatient Prescriptions on File Prior to Visit  Medication Sig Dispense Refill  . albuterol (PROVENTIL HFA;VENTOLIN HFA) 108 (90 BASE) MCG/ACT inhaler Inhale 2 puffs into the lungs as needed for wheezing or shortness of breath.    . calcium carbonate 200 MG capsule Take 250 mg by mouth daily.    . cetirizine (ZYRTEC) 10 MG tablet Take 10 mg by mouth daily.    . cholecalciferol (VITAMIN D) 1000 UNITS tablet Take 1,000 Units by mouth daily.    Marland Kitchen DYMISTA 137-50 MCG/ACT SUSP Place 1 spray into both nostrils 2 (two) times daily.     Marland Kitchen esomeprazole (NEXIUM) 40 MG capsule Take 40 mg by mouth daily at 12 noon.    . Fluticasone Furoate-Vilanterol 100-25 MCG/INH AEPB Inhale 1 puff into the lungs daily.    . montelukast (SINGULAIR) 10 MG tablet Take 10 mg by mouth at bedtime.    . Turmeric 500 MG CAPS Take by mouth daily.    . vitamin C (ASCORBIC ACID) 500 MG tablet Take 500 mg by mouth daily.     No current facility-administered medications on file prior to visit.    No Known Allergies  Past Medical History  Diagnosis Date  . Allergy     . Anemia     in the past   . Asthma   . GERD (gastroesophageal reflux disease)   . Hyperlipidemia     Past Surgical History  Procedure Laterality Date  . Colonoscopy    . Polypectomy    . Abdominal hysterectomy      Family History  Problem Relation Age of Onset  . Colon cancer Mother   . Lung cancer Father   . Rectal cancer Neg Hx   . Stomach cancer Neg Hx     Social History   Social History  . Marital Status: Married    Spouse Name: N/A  . Number of Children: N/A  . Years of Education: N/A   Occupational History  . Not on file.   Social History Main Topics  . Smoking status: Former Research scientist (life sciences)  . Smokeless tobacco: Never Used  . Alcohol Use: 4.2 oz/week    7 Standard drinks or equivalent per week     Comment: 1 glass of wine a day   . Drug Use: No  . Sexual Activity: Not on file   Other Topics Concern  . Not on file   Social History Narrative   The PMH, PSH, Social History, Family History, Medications, and allergies have been reviewed in [place],  and have been updated if relevant.  Review of Systems  Constitutional: Negative.   HENT: Negative.   Eyes: Negative.   Respiratory: Negative.   Cardiovascular: Negative.   Gastrointestinal: Negative.   Endocrine: Negative.   Genitourinary: Negative.   Musculoskeletal: Negative.   Skin: Negative.   Allergic/Immunologic: Negative.   Neurological: Negative.   Hematological: Negative.   Psychiatric/Behavioral: Negative.   All other systems reviewed and are negative.      Objective:    BP 136/78 mmHg  Pulse 70  Temp(Src) 98.3 F (36.8 C) (Oral)  Wt 203 lb 7 oz (92.279 kg)  SpO2 97%   Physical Exam  Constitutional: She is oriented to person, place, and time. She appears well-developed and well-nourished. No distress.  HENT:  Head: Normocephalic and atraumatic.  Eyes: Conjunctivae are normal.  Neck: Normal range of motion.  Cardiovascular: Normal rate, regular rhythm and normal heart sounds.    Pulmonary/Chest: Effort normal and breath sounds normal. No respiratory distress.  Abdominal: Soft.  Musculoskeletal: Normal range of motion. She exhibits no edema.  Neurological: She is alert and oriented to person, place, and time. No cranial nerve deficit.  Skin: Skin is warm and dry.  Psychiatric: She has a normal mood and affect. Her behavior is normal. Judgment and thought content normal.  Nursing note and vitals reviewed.         Assessment & Plan:   HLD (hyperlipidemia) - Plan: Comprehensive metabolic panel, Lipid panel  Allergic asthma, unspecified asthma severity, uncomplicated  Gastroesophageal reflux disease, esophagitis presence not specified  Hyperlipidemia - Plan: atorvastatin (LIPITOR) 20 MG tablet  Need for influenza vaccination - Plan: Flu Vaccine QUAD 36+ mos PF IM (Fluarix & Fluzone Quad PF)  Post menopausal syndrome - Plan: CBC with Differential/Platelet, TSH  Screening for HIV (human immunodeficiency virus) - Plan: HIV antibody (with reflex)  Need for hepatitis C screening test - Plan: Hepatitis C Antibody No Follow-up on file.

## 2015-05-27 NOTE — Assessment & Plan Note (Signed)
Continue current dose of lipitor. Due for labs today. Orders Placed This Encounter  Procedures  . Flu Vaccine QUAD 36+ mos PF IM (Fluarix & Fluzone Quad PF)  . CBC with Differential/Platelet  . Comprehensive metabolic panel  . Lipid panel  . TSH  . Hepatitis C Antibody  . HIV antibody (with reflex)

## 2015-05-27 NOTE — Assessment & Plan Note (Signed)
On HRT. She is aware of risks and would like to continue using topical premarin.

## 2015-05-27 NOTE — Assessment & Plan Note (Signed)
Quiet on current dose of PPI. No changes to rxs today.

## 2015-05-27 NOTE — Assessment & Plan Note (Signed)
Followed by Dr. Donneta Romberg. No changes to rxs- symptoms controlled.

## 2015-05-28 ENCOUNTER — Encounter: Payer: Self-pay | Admitting: *Deleted

## 2015-05-28 LAB — HIV ANTIBODY (ROUTINE TESTING W REFLEX): HIV: NONREACTIVE

## 2015-05-28 LAB — HEPATITIS C ANTIBODY: HCV Ab: NEGATIVE

## 2015-11-01 ENCOUNTER — Other Ambulatory Visit: Payer: Self-pay | Admitting: Family Medicine

## 2015-11-01 NOTE — Telephone Encounter (Signed)
Last labs 05/2015-abnormal. pls advise

## 2015-11-25 ENCOUNTER — Ambulatory Visit (INDEPENDENT_AMBULATORY_CARE_PROVIDER_SITE_OTHER): Payer: BC Managed Care – PPO | Admitting: Family Medicine

## 2015-11-25 ENCOUNTER — Encounter: Payer: Self-pay | Admitting: Family Medicine

## 2015-11-25 VITALS — BP 132/84 | HR 66 | Temp 98.0°F | Ht 65.0 in | Wt 202.0 lb

## 2015-11-25 DIAGNOSIS — Z01419 Encounter for gynecological examination (general) (routine) without abnormal findings: Secondary | ICD-10-CM | POA: Insufficient documentation

## 2015-11-25 DIAGNOSIS — K219 Gastro-esophageal reflux disease without esophagitis: Secondary | ICD-10-CM

## 2015-11-25 DIAGNOSIS — M949 Disorder of cartilage, unspecified: Secondary | ICD-10-CM

## 2015-11-25 DIAGNOSIS — E785 Hyperlipidemia, unspecified: Secondary | ICD-10-CM

## 2015-11-25 DIAGNOSIS — Z0000 Encounter for general adult medical examination without abnormal findings: Secondary | ICD-10-CM

## 2015-11-25 DIAGNOSIS — Z800 Family history of malignant neoplasm of digestive organs: Secondary | ICD-10-CM | POA: Diagnosis not present

## 2015-11-25 DIAGNOSIS — J45909 Unspecified asthma, uncomplicated: Secondary | ICD-10-CM

## 2015-11-25 DIAGNOSIS — M899 Disorder of bone, unspecified: Secondary | ICD-10-CM

## 2015-11-25 LAB — CBC WITH DIFFERENTIAL/PLATELET
Basophils Absolute: 0.1 10*3/uL (ref 0.0–0.1)
Basophils Relative: 0.6 % (ref 0.0–3.0)
EOS PCT: 3.1 % (ref 0.0–5.0)
Eosinophils Absolute: 0.2 10*3/uL (ref 0.0–0.7)
HCT: 41.7 % (ref 36.0–46.0)
Hemoglobin: 14 g/dL (ref 12.0–15.0)
LYMPHS ABS: 2.2 10*3/uL (ref 0.7–4.0)
Lymphocytes Relative: 28.5 % (ref 12.0–46.0)
MCHC: 33.5 g/dL (ref 30.0–36.0)
MCV: 91.4 fl (ref 78.0–100.0)
MONO ABS: 0.4 10*3/uL (ref 0.1–1.0)
Monocytes Relative: 5.2 % (ref 3.0–12.0)
NEUTROS PCT: 62.6 % (ref 43.0–77.0)
Neutro Abs: 4.9 10*3/uL (ref 1.4–7.7)
Platelets: 339 10*3/uL (ref 150.0–400.0)
RBC: 4.57 Mil/uL (ref 3.87–5.11)
RDW: 14.1 % (ref 11.5–15.5)
WBC: 7.9 10*3/uL (ref 4.0–10.5)

## 2015-11-25 LAB — LIPID PANEL
CHOL/HDL RATIO: 5
Cholesterol: 188 mg/dL (ref 0–200)
HDL: 41.3 mg/dL (ref >39.00)
NONHDL: 147.01
Triglycerides: 244 mg/dL — ABNORMAL HIGH (ref 0.0–149.0)
VLDL: 48.8 mg/dL — AB (ref 0.0–40.0)

## 2015-11-25 LAB — COMPREHENSIVE METABOLIC PANEL
ALK PHOS: 88 U/L (ref 39–117)
ALT: 41 U/L — ABNORMAL HIGH (ref 0–35)
AST: 25 U/L (ref 0–37)
Albumin: 4.6 g/dL (ref 3.5–5.2)
BUN: 11 mg/dL (ref 6–23)
CHLORIDE: 102 meq/L (ref 96–112)
CO2: 29 mEq/L (ref 19–32)
Calcium: 10 mg/dL (ref 8.4–10.5)
Creatinine, Ser: 0.81 mg/dL (ref 0.40–1.20)
GFR: 75.52 mL/min (ref >60.00)
GLUCOSE: 113 mg/dL — AB (ref 70–99)
POTASSIUM: 4 meq/L (ref 3.5–5.1)
SODIUM: 140 meq/L (ref 135–145)
TOTAL PROTEIN: 7.5 g/dL (ref 6.0–8.3)
Total Bilirubin: 0.4 mg/dL (ref 0.2–1.2)

## 2015-11-25 LAB — TSH: TSH: 2.61 u[IU]/mL (ref 0.35–4.50)

## 2015-11-25 LAB — LDL CHOLESTEROL, DIRECT: LDL DIRECT: 125 mg/dL

## 2015-11-25 NOTE — Assessment & Plan Note (Signed)
Reviewed preventive care protocols, scheduled due services, and updated immunizations Discussed nutrition, exercise, diet, and healthy lifestyle.  Orders Placed This Encounter  Procedures  . CBC with Differential/Platelet  . Comprehensive metabolic panel  . Lipid panel  . TSH     

## 2015-11-25 NOTE — Patient Instructions (Signed)
Congratulations! It was great to see you. We will call you with your lab results and you can view them online.

## 2015-11-25 NOTE — Assessment & Plan Note (Signed)
Followed by allergist. No changes made today.

## 2015-11-25 NOTE — Progress Notes (Signed)
Subjective:   Patient ID: Melinda Tran, female    DOB: 09-Dec-1950, 65 y.o.   MRN: [id number]  JANELL WIEDEMANN is a pleasant 65 y.o. year old female who presents to clinic today with Annual Exam  on 11/25/2015  HPI:  Remote h/o hysterectomy. Mammogram 02/05/14 Colonoscopy 09/23/14 Zostavax 04/16/14 Flu vaccine 05/27/15   HLD- taking lipitor 20 mg daily. Denies myalgias. Lab Results  Component Value Date   CHOL 174 05/27/2015   HDL 43.70 05/27/2015   LDLCALC 90 05/27/2015   LDLDIRECT 199.8 04/16/2014   TRIG 200.0* 05/27/2015   CHOLHDL 4 05/27/2015   Lab Results  Component Value Date   ALT 33 05/27/2015   AST 21 05/27/2015   ALKPHOS 72 05/27/2015   BILITOT 0.5 05/27/2015    Has allergic asthma- followed by Dr. Donneta Romberg.  Receiving allergy shots weekly.  Also uses Singulair, Zyrtec and proair.  No recent flares.  GERD- takes Nexium 40 mg daily.  Symptoms well controlled.    Current Outpatient Prescriptions on File Prior to Visit  Medication Sig Dispense Refill  . albuterol (PROVENTIL HFA;VENTOLIN HFA) 108 (90 BASE) MCG/ACT inhaler Inhale 2 puffs into the lungs as needed for wheezing or shortness of breath.    Marland Kitchen atorvastatin (LIPITOR) 20 MG tablet TAKE 1 TABLET (20 MG TOTAL) BY MOUTH DAILY. 30 tablet 2  . calcium carbonate 200 MG capsule Take 250 mg by mouth daily.    . cetirizine (ZYRTEC) 10 MG tablet Take 10 mg by mouth daily.    . cholecalciferol (VITAMIN D) 1000 UNITS tablet Take 1,000 Units by mouth daily.    Marland Kitchen esomeprazole (NEXIUM) 40 MG capsule Take 40 mg by mouth daily at 12 noon.    . Fluticasone Furoate-Vilanterol 100-25 MCG/INH AEPB Inhale 1 puff into the lungs daily.    . montelukast (SINGULAIR) 10 MG tablet Take 10 mg by mouth at bedtime.    . Turmeric 500 MG CAPS Take by mouth daily.    . vitamin C (ASCORBIC ACID) 500 MG tablet Take 500 mg by mouth daily.     No current facility-administered medications on file prior to visit.    No Known Allergies  Past  Medical History  Diagnosis Date  . Allergy   . Anemia     in the past   . Asthma   . GERD (gastroesophageal reflux disease)   . Hyperlipidemia     Past Surgical History  Procedure Laterality Date  . Colonoscopy    . Polypectomy    . Abdominal hysterectomy      Family History  Problem Relation Age of Onset  . Colon cancer Mother   . Lung cancer Father   . Rectal cancer Neg Hx   . Stomach cancer Neg Hx     Social History   Social History  . Marital Status: Married    Spouse Name: N/A  . Number of Children: N/A  . Years of Education: N/A   Occupational History  . Not on file.   Social History Main Topics  . Smoking status: Former Research scientist (life sciences)  . Smokeless tobacco: Never Used  . Alcohol Use: 4.2 oz/week    7 Standard drinks or equivalent per week     Comment: 1 glass of wine a day   . Drug Use: No  . Sexual Activity: Not on file   Other Topics Concern  . Not on file   Social History Narrative   The PMH, PSH, Social History, Family History, Medications, and  allergies have been reviewed in [facility], and have been updated if relevant.  Review of Systems  Constitutional: Negative.   HENT: Negative.   Eyes: Negative.   Respiratory: Negative.   Cardiovascular: Negative.   Gastrointestinal: Negative.   Endocrine: Negative.   Genitourinary: Negative.   Musculoskeletal: Negative.   Skin: Negative.   Allergic/Immunologic: Negative.   Neurological: Negative.   Hematological: Negative.   Psychiatric/Behavioral: Negative.   All other systems reviewed and are negative.      Objective:    BP 132/84 mmHg  Pulse 66  Temp(Src) 98 F (36.7 C) (Oral)  Ht 5\' 5"  (1.651 m)  Wt 202 lb (91.627 kg)  BMI 33.61 kg/m2  SpO2 98%   Physical Exam  Constitutional: She is oriented to person, place, and time. She appears well-developed and well-nourished. No distress.  HENT:  Head: Normocephalic and atraumatic.  Eyes: Conjunctivae are normal.  Neck: Normal range of motion.    Cardiovascular: Normal rate, regular rhythm and normal heart sounds.   Pulmonary/Chest: Effort normal and breath sounds normal. No respiratory distress. Right breast exhibits no inverted nipple, no mass, no nipple discharge, no skin change and no tenderness. Left breast exhibits no inverted nipple, no mass, no nipple discharge, no skin change and no tenderness. Breasts are symmetrical.  Abdominal: Soft.  Genitourinary: Rectum normal. Right adnexum displays no mass and no tenderness. Left adnexum displays no mass and no tenderness.  Musculoskeletal: Normal range of motion. She exhibits no edema.  Neurological: She is alert and oriented to person, place, and time. No cranial nerve deficit.  Skin: Skin is warm and dry.  Psychiatric: She has a normal mood and affect. Her behavior is normal. Judgment and thought content normal.  Nursing note and vitals reviewed.         Assessment & Plan:   Well woman exam  Family history of colon cancer in mother  Gastroesophageal reflux disease, esophagitis presence not specified  HLD (hyperlipidemia)  Disorder of bone and cartilage No Follow-up on file.

## 2015-11-25 NOTE — Assessment & Plan Note (Signed)
Colonoscopy UTD. 

## 2015-11-25 NOTE — Assessment & Plan Note (Signed)
Continue current dose of statin. Check labs today. 

## 2015-11-25 NOTE — Progress Notes (Signed)
Pre visit review using our clinic review tool, if applicable. No additional management support is needed unless otherwise documented below in the visit note. 

## 2015-11-26 ENCOUNTER — Encounter: Payer: Self-pay | Admitting: *Deleted

## 2016-02-03 ENCOUNTER — Other Ambulatory Visit: Payer: Self-pay | Admitting: Family Medicine

## 2016-05-08 ENCOUNTER — Other Ambulatory Visit: Payer: Self-pay | Admitting: Family Medicine

## 2016-05-23 ENCOUNTER — Ambulatory Visit (INDEPENDENT_AMBULATORY_CARE_PROVIDER_SITE_OTHER): Payer: Medicare Other

## 2016-05-23 DIAGNOSIS — Z23 Encounter for immunization: Secondary | ICD-10-CM | POA: Diagnosis not present

## 2016-08-14 ENCOUNTER — Other Ambulatory Visit: Payer: Self-pay | Admitting: Family Medicine

## 2016-11-16 ENCOUNTER — Other Ambulatory Visit: Payer: Self-pay | Admitting: Family Medicine

## 2016-11-19 ENCOUNTER — Other Ambulatory Visit: Payer: Self-pay | Admitting: Family Medicine

## 2016-12-28 ENCOUNTER — Encounter: Payer: Self-pay | Admitting: Family Medicine

## 2016-12-28 ENCOUNTER — Ambulatory Visit (INDEPENDENT_AMBULATORY_CARE_PROVIDER_SITE_OTHER): Payer: Medicare Other | Admitting: Family Medicine

## 2016-12-28 VITALS — BP 130/82 | HR 61 | Wt 199.5 lb

## 2016-12-28 DIAGNOSIS — E785 Hyperlipidemia, unspecified: Secondary | ICD-10-CM

## 2016-12-28 LAB — COMPREHENSIVE METABOLIC PANEL
ALBUMIN: 4.2 g/dL (ref 3.5–5.2)
ALK PHOS: 73 U/L (ref 39–117)
ALT: 27 U/L (ref 0–35)
AST: 18 U/L (ref 0–37)
BUN: 13 mg/dL (ref 6–23)
CO2: 30 mEq/L (ref 19–32)
Calcium: 9.7 mg/dL (ref 8.4–10.5)
Chloride: 105 mEq/L (ref 96–112)
Creatinine, Ser: 0.77 mg/dL (ref 0.40–1.20)
GFR: 79.79 mL/min (ref >60.00)
Glucose, Bld: 93 mg/dL (ref 70–99)
POTASSIUM: 4 meq/L (ref 3.5–5.1)
SODIUM: 139 meq/L (ref 135–145)
TOTAL PROTEIN: 6.7 g/dL (ref 6.0–8.3)
Total Bilirubin: 0.4 mg/dL (ref 0.2–1.2)

## 2016-12-28 LAB — LDL CHOLESTEROL, DIRECT: Direct LDL: 149 mg/dL

## 2016-12-28 LAB — LIPID PANEL
CHOLESTEROL: 216 mg/dL — AB (ref 0–200)
HDL: 43.4 mg/dL (ref >39.00)
NonHDL: 172.53
TRIGLYCERIDES: 202 mg/dL — AB (ref 0.0–149.0)
Total CHOL/HDL Ratio: 5
VLDL: 40.4 mg/dL — AB (ref 0.0–40.0)

## 2016-12-28 MED ORDER — ATORVASTATIN CALCIUM 20 MG PO TABS: 20.0000 mg | ORAL_TABLET | Freq: Every day | ORAL | 0 refills | Status: DC

## 2016-12-28 NOTE — Patient Instructions (Signed)
Great to see you. We will call you with your lab results and you can view them online.  

## 2016-12-28 NOTE — Progress Notes (Signed)
Subjective:   Patient ID: Melinda Tran, female    DOB: 11-21-1950, 66 y.o.   MRN: [id number]  Melinda Tran is a pleasant 66 y.o. year old female who presents to clinic today with Medication Refill  on 12/28/2016  HPI:  HLD- taking lipitor 20 mg daily. Denies myalgias. Due for labs.  Lab Results  Component Value Date   CHOL 188 11/25/2015   HDL 41.30 11/25/2015   LDLCALC 90 05/27/2015   LDLDIRECT 125.0 11/25/2015   TRIG 244.0 (H) 11/25/2015   CHOLHDL 5 11/25/2015   Current Outpatient Prescriptions on File Prior to Visit  Medication Sig Dispense Refill  . albuterol (PROVENTIL HFA;VENTOLIN HFA) 108 (90 BASE) MCG/ACT inhaler Inhale 2 puffs into the lungs as needed for wheezing or shortness of breath.    Marland Kitchen atorvastatin (LIPITOR) 20 MG tablet Take 1 tablet (20 mg total) by mouth daily. PLEASE SCHEDULE OV OR PHYSICAL 30 tablet 1  . calcium carbonate 200 MG capsule Take 250 mg by mouth daily.    . cetirizine (ZYRTEC) 10 MG tablet Take 10 mg by mouth daily.    . cholecalciferol (VITAMIN D) 1000 UNITS tablet Take 1,000 Units by mouth daily.    Marland Kitchen esomeprazole (NEXIUM) 40 MG capsule Take 40 mg by mouth daily at 12 noon.    . Fluticasone Furoate-Vilanterol 100-25 MCG/INH AEPB Inhale 1 puff into the lungs daily.    . montelukast (SINGULAIR) 10 MG tablet Take 10 mg by mouth at bedtime.    . Turmeric 500 MG CAPS Take by mouth daily.    . vitamin C (ASCORBIC ACID) 500 MG tablet Take 500 mg by mouth daily.     No current facility-administered medications on file prior to visit.     No Known Allergies  Past Medical History:  Diagnosis Date  . Allergy   . Anemia    in the past   . Asthma   . GERD (gastroesophageal reflux disease)   . Hyperlipidemia     Past Surgical History:  Procedure Laterality Date  . ABDOMINAL HYSTERECTOMY    . COLONOSCOPY    . POLYPECTOMY      Family History  Problem Relation Age of Onset  . Colon cancer Mother   . Lung cancer Father   . Rectal cancer  Neg Hx   . Stomach cancer Neg Hx     Social History   Social History  . Marital status: Married    Spouse name: N/A  . Number of children: N/A  . Years of education: N/A   Occupational History  . Not on file.   Social History Main Topics  . Smoking status: Former Research scientist (life sciences)  . Smokeless tobacco: Never Used  . Alcohol use 4.2 oz/week    7 Standard drinks or equivalent per week     Comment: 1 glass of wine a day   . Drug use: No  . Sexual activity: Not on file   Other Topics Concern  . Not on file   Social History Narrative  . No narrative on file   The PMH, PSH, Social History, Family History, Medications, and allergies have been reviewed in [facility], and have been updated if relevant.   Review of Systems  Constitutional: Negative.   HENT: Negative.   Eyes: Negative.   Respiratory: Negative.   Cardiovascular: Negative.   Gastrointestinal: Negative.   Endocrine: Negative.   Genitourinary: Negative.   Musculoskeletal: Negative.   Skin: Negative.   Allergic/Immunologic: Negative.   Neurological: Negative.  Hematological: Negative.   Psychiatric/Behavioral: Negative.   All other systems reviewed and are negative.      Objective:    BP 130/82   Pulse 61   Wt 199 lb 8 oz (90.5 kg)   SpO2 97%   BMI 33.20 kg/m    Physical Exam  Constitutional: She is oriented to person, place, and time. She appears well-developed and well-nourished. No distress.  HENT:  Head: Normocephalic and atraumatic.  Eyes: Conjunctivae are normal.  Cardiovascular: Normal rate and regular rhythm.   Pulmonary/Chest: Effort normal and breath sounds normal.  Musculoskeletal: Normal range of motion. She exhibits no edema.  Neurological: She is alert and oriented to person, place, and time. No cranial nerve deficit.  Skin: Skin is warm and dry. She is not diaphoretic.  Psychiatric: She has a normal mood and affect. Her behavior is normal. Judgment and thought content normal.  Nursing note and  vitals reviewed.         Assessment & Plan:   Hyperlipidemia, unspecified hyperlipidemia type No Follow-up on file.

## 2016-12-28 NOTE — Assessment & Plan Note (Signed)
Continue current dose of lipitor- eRx refills sent. Check lipid panel and CMET today. The patient indicates understanding of these issues and agrees with the plan. Orders Placed This Encounter  Procedures  . Comprehensive metabolic panel  . Lipid panel

## 2017-01-19 ENCOUNTER — Ambulatory Visit
Admission: RE | Admit: 2017-01-19 | Discharge: 2017-01-19 | Disposition: A | Discharge status: Home / Self Care | Payer: Medicare Other | Source: Ambulatory Visit | Attending: Allergy | Admitting: Allergy

## 2017-01-19 ENCOUNTER — Other Ambulatory Visit: Payer: Self-pay | Admitting: Allergy

## 2017-01-19 DIAGNOSIS — J209 Acute bronchitis, unspecified: Secondary | ICD-10-CM

## 2017-03-25 ENCOUNTER — Other Ambulatory Visit: Payer: Self-pay | Admitting: Family Medicine

## 2017-03-28 ENCOUNTER — Telehealth: Payer: Self-pay | Admitting: Family Medicine

## 2017-03-28 NOTE — Telephone Encounter (Signed)
Called pt to schedule Welcome to Medicare Exam before 04/07/2017.  No answer, VM full  Melinda Tran call back # [phone]

## 2017-05-17 ENCOUNTER — Ambulatory Visit (INDEPENDENT_AMBULATORY_CARE_PROVIDER_SITE_OTHER): Payer: Medicare Other

## 2017-05-17 DIAGNOSIS — Z23 Encounter for immunization: Secondary | ICD-10-CM | POA: Diagnosis not present

## 2017-06-25 ENCOUNTER — Encounter: Payer: Self-pay | Admitting: Family Medicine

## 2017-06-25 ENCOUNTER — Other Ambulatory Visit: Payer: Self-pay | Admitting: Family Medicine

## 2017-06-25 ENCOUNTER — Ambulatory Visit: Payer: Medicare Other | Admitting: Family Medicine

## 2017-06-25 VITALS — BP 118/76 | HR 69 | Temp 97.8°F | Ht 65.0 in | Wt 196.0 lb

## 2017-06-25 DIAGNOSIS — E785 Hyperlipidemia, unspecified: Secondary | ICD-10-CM | POA: Diagnosis not present

## 2017-06-25 DIAGNOSIS — Z23 Encounter for immunization: Secondary | ICD-10-CM

## 2017-06-25 LAB — LIPID PANEL
CHOLESTEROL: 196 mg/dL (ref 0–200)
HDL: 44.1 mg/dL (ref >39.00)
LDL CALC: 120 mg/dL — AB (ref 0–99)
NonHDL: 152.07
Total CHOL/HDL Ratio: 4
Triglycerides: 161 mg/dL — ABNORMAL HIGH (ref 0.0–149.0)
VLDL: 32.2 mg/dL (ref 0.0–40.0)

## 2017-06-25 LAB — COMPREHENSIVE METABOLIC PANEL
ALBUMIN: 4.4 g/dL (ref 3.5–5.2)
ALT: 16 U/L (ref 0–35)
AST: 13 U/L (ref 0–37)
Alkaline Phosphatase: 89 U/L (ref 39–117)
BUN: 14 mg/dL (ref 6–23)
CALCIUM: 9.9 mg/dL (ref 8.4–10.5)
CHLORIDE: 103 meq/L (ref 96–112)
CO2: 31 mEq/L (ref 19–32)
CREATININE: 0.69 mg/dL (ref 0.40–1.20)
GFR: 90.42 mL/min (ref >60.00)
Glucose, Bld: 117 mg/dL — ABNORMAL HIGH (ref 70–99)
POTASSIUM: 4.5 meq/L (ref 3.5–5.1)
Sodium: 140 mEq/L (ref 135–145)
Total Bilirubin: 0.8 mg/dL (ref 0.2–1.2)
Total Protein: 7.2 g/dL (ref 6.0–8.3)

## 2017-06-25 MED ORDER — ATORVASTATIN CALCIUM 20 MG PO TABS: 20.0000 mg | ORAL_TABLET | Freq: Every day | ORAL | 0 refills | Status: DC

## 2017-06-25 NOTE — Patient Instructions (Signed)
Great to see you. We will call you with your lab results and you can view them online.  

## 2017-06-25 NOTE — Assessment & Plan Note (Signed)
Continue current dose of lipitor. Check labs today. The patient indicates understanding of these issues and agrees with the plan. Orders Placed This Encounter  Procedures  . Pneumococcal conjugate vaccine 13-valent  . Comprehensive metabolic panel  . Lipid panel

## 2017-06-25 NOTE — Progress Notes (Signed)
Subjective:   Patient ID: Melinda Tran, female    DOB: [birth year], 66 y.o.   MRN: [id number]  Melinda Tran is a pleasant 66 y.o. year old female who presents to clinic today with Medication Refill (lipitor)  on 06/25/2017  HPI:  HLD- taking lipitor 20 mg daily. Denies myalgias. Due for labs.  Doing well.  No complaints today. Lab Results  Component Value Date   CHOL 216 (H) 12/28/2016   HDL 43.40 12/28/2016   LDLCALC 90 05/27/2015   LDLDIRECT 149.0 12/28/2016   TRIG 202.0 (H) 12/28/2016   CHOLHDL 5 12/28/2016   Current Outpatient Medications on File Prior to Visit  Medication Sig Dispense Refill  . albuterol (PROVENTIL HFA;VENTOLIN HFA) 108 (90 BASE) MCG/ACT inhaler Inhale 2 puffs into the lungs as needed for wheezing or shortness of breath.    . calcium carbonate 200 MG capsule Take 250 mg by mouth daily.    . cetirizine (ZYRTEC) 10 MG tablet Take 10 mg by mouth daily.    . cholecalciferol (VITAMIN D) 1000 UNITS tablet Take 1,000 Units by mouth daily.    Marland Kitchen esomeprazole (NEXIUM) 40 MG capsule Take 40 mg by mouth daily at 12 noon.    . fluticasone-salmeterol (ADVAIR HFA) 230-21 MCG/ACT inhaler     . montelukast (SINGULAIR) 10 MG tablet Take 10 mg by mouth at bedtime.    . Turmeric 500 MG CAPS Take by mouth daily.    . vitamin C (ASCORBIC ACID) 500 MG tablet Take 500 mg by mouth daily.     No current facility-administered medications on file prior to visit.     No Known Allergies  Past Medical History:  Diagnosis Date  . Allergy   . Anemia    in the past   . Asthma   . GERD (gastroesophageal reflux disease)   . Hyperlipidemia     Past Surgical History:  Procedure Laterality Date  . ABDOMINAL HYSTERECTOMY    . COLONOSCOPY    . POLYPECTOMY      Family History  Problem Relation Age of Onset  . Colon cancer Mother   . Lung cancer Father   . Rectal cancer Neg Hx   . Stomach cancer Neg Hx     Social History   Socioeconomic History  . Marital status:  Married    Spouse name: Not on file  . Number of children: Not on file  . Years of education: Not on file  . Highest education level: Not on file  Social Needs  . Financial resource strain: Not on file  . Food insecurity - worry: Not on file  . Food insecurity - inability: Not on file  . Transportation needs - medical: Not on file  . Transportation needs - non-medical: Not on file  Occupational History  . Not on file  Tobacco Use  . Smoking status: Former Research scientist (life sciences)  . Smokeless tobacco: Never Used  Substance and Sexual Activity  . Alcohol use: Yes    Alcohol/week: 4.2 oz    Types: 7 Standard drinks or equivalent per week    Comment: 1 glass of wine a day   . Drug use: No  . Sexual activity: Not on file  Other Topics Concern  . Not on file  Social History Narrative  . Not on file   The PMH, PSH, Social History, Family History, Medications, and allergies have been reviewed in [facility], and have been updated if relevant.   Review of Systems  Constitutional: Negative.  HENT: Negative.   Eyes: Negative.   Respiratory: Negative.   Cardiovascular: Negative.   Gastrointestinal: Negative.   Endocrine: Negative.   Genitourinary: Negative.   Musculoskeletal: Negative.   Skin: Negative.   Allergic/Immunologic: Negative.   Neurological: Negative.   Hematological: Negative.   Psychiatric/Behavioral: Negative.   All other systems reviewed and are negative.      Objective:    BP 118/76 (BP Location: Left Arm, Patient Position: Sitting, Cuff Size: Normal)   Pulse 69   Temp 97.8 F (36.6 C) (Oral)   Ht 5\' 5"  (1.651 m)   Wt 196 lb (88.9 kg)   SpO2 98%   BMI 32.62 kg/m    Physical Exam  Constitutional: She is oriented to person, place, and time. She appears well-developed and well-nourished. No distress.  HENT:  Head: Normocephalic and atraumatic.  Eyes: Conjunctivae are normal.  Cardiovascular: Normal rate and regular rhythm.  Pulmonary/Chest: Effort normal and breath  sounds normal.  Musculoskeletal: Normal range of motion. She exhibits no edema.  Neurological: She is alert and oriented to person, place, and time. No cranial nerve deficit.  Skin: Skin is warm and dry. She is not diaphoretic.  Psychiatric: She has a normal mood and affect. Her behavior is normal. Judgment and thought content normal.  Nursing note and vitals reviewed.         Assessment & Plan:   Hyperlipidemia, unspecified hyperlipidemia type - Plan: Comprehensive metabolic panel, Lipid panel No Follow-up on file.

## 2017-09-25 ENCOUNTER — Other Ambulatory Visit: Payer: Self-pay | Admitting: Family Medicine

## 2018-03-22 ENCOUNTER — Telehealth: Payer: Self-pay

## 2018-03-22 ENCOUNTER — Encounter: Payer: Self-pay | Admitting: Family Medicine

## 2018-03-22 ENCOUNTER — Other Ambulatory Visit: Payer: Self-pay | Admitting: Family Medicine

## 2018-03-22 NOTE — Telephone Encounter (Signed)
PEC-I tried to call pt but VM is full/I ended up mailing her a letter stating that we would like to have her scheduled for an AWV on a Wednesday, have fasting labs just following that, and then see Dr. Deborra Medina for a Follow-Up AWV in the same day/If she calls please schedule her for the AWV and a Follow-up OV with Dr. Deborra Medina afterwards and send me a message so that I can put future orders for her labs to be done that day/thank you/dmf

## 2018-04-16 ENCOUNTER — Encounter: Payer: Self-pay | Admitting: Family Medicine

## 2018-04-29 ENCOUNTER — Ambulatory Visit (INDEPENDENT_AMBULATORY_CARE_PROVIDER_SITE_OTHER): Payer: Medicare Other | Admitting: Family Medicine

## 2018-04-29 ENCOUNTER — Encounter: Payer: Self-pay | Admitting: Family Medicine

## 2018-04-29 VITALS — BP 128/80 | HR 66 | Temp 98.6°F | Ht 65.16 in | Wt 200.0 lb

## 2018-04-29 DIAGNOSIS — E785 Hyperlipidemia, unspecified: Secondary | ICD-10-CM | POA: Diagnosis not present

## 2018-04-29 DIAGNOSIS — E2839 Other primary ovarian failure: Secondary | ICD-10-CM | POA: Diagnosis not present

## 2018-04-29 DIAGNOSIS — J449 Chronic obstructive pulmonary disease, unspecified: Secondary | ICD-10-CM

## 2018-04-29 DIAGNOSIS — K219 Gastro-esophageal reflux disease without esophagitis: Secondary | ICD-10-CM

## 2018-04-29 DIAGNOSIS — Z0000 Encounter for general adult medical examination without abnormal findings: Secondary | ICD-10-CM | POA: Diagnosis not present

## 2018-04-29 DIAGNOSIS — Z23 Encounter for immunization: Secondary | ICD-10-CM | POA: Diagnosis not present

## 2018-04-29 DIAGNOSIS — Z1239 Encounter for other screening for malignant neoplasm of breast: Secondary | ICD-10-CM

## 2018-04-29 DIAGNOSIS — J4489 Other specified chronic obstructive pulmonary disease: Secondary | ICD-10-CM | POA: Insufficient documentation

## 2018-04-29 DIAGNOSIS — Z1231 Encounter for screening mammogram for malignant neoplasm of breast: Secondary | ICD-10-CM

## 2018-04-29 LAB — LIPID PANEL
Cholesterol: 159 mg/dL (ref 0–200)
HDL: 38.3 mg/dL — AB (ref >39.00)
LDL Cholesterol: 84 mg/dL (ref 0–99)
NonHDL: 120.89
Total CHOL/HDL Ratio: 4
Triglycerides: 185 mg/dL — ABNORMAL HIGH (ref 0.0–149.0)
VLDL: 37 mg/dL (ref 0.0–40.0)

## 2018-04-29 LAB — CBC WITH DIFFERENTIAL/PLATELET
BASOS ABS: 0.1 10*3/uL (ref 0.0–0.1)
Basophils Relative: 1.1 % (ref 0.0–3.0)
EOS ABS: 0.2 10*3/uL (ref 0.0–0.7)
EOS PCT: 2.8 % (ref 0.0–5.0)
HEMATOCRIT: 38.4 % (ref 36.0–46.0)
Hemoglobin: 13.1 g/dL (ref 12.0–15.0)
LYMPHS ABS: 2 10*3/uL (ref 0.7–4.0)
Lymphocytes Relative: 31.7 % (ref 12.0–46.0)
MCHC: 34 g/dL (ref 30.0–36.0)
MCV: 89.5 fl (ref 78.0–100.0)
Monocytes Absolute: 0.5 10*3/uL (ref 0.1–1.0)
Monocytes Relative: 7.4 % (ref 3.0–12.0)
NEUTROS PCT: 57 % (ref 43.0–77.0)
Neutro Abs: 3.5 10*3/uL (ref 1.4–7.7)
Platelets: 290 10*3/uL (ref 150.0–400.0)
RBC: 4.29 Mil/uL (ref 3.87–5.11)
RDW: 13.5 % (ref 11.5–15.5)
WBC: 6.2 10*3/uL (ref 4.0–10.5)

## 2018-04-29 LAB — COMPREHENSIVE METABOLIC PANEL
ALK PHOS: 79 U/L (ref 39–117)
ALT: 20 U/L (ref 0–35)
AST: 15 U/L (ref 0–37)
Albumin: 4.4 g/dL (ref 3.5–5.2)
BILIRUBIN TOTAL: 0.6 mg/dL (ref 0.2–1.2)
BUN: 10 mg/dL (ref 6–23)
CALCIUM: 9.7 mg/dL (ref 8.4–10.5)
CO2: 27 mEq/L (ref 19–32)
Chloride: 105 mEq/L (ref 96–112)
Creatinine, Ser: 0.77 mg/dL (ref 0.40–1.20)
GFR: 79.46 mL/min (ref >60.00)
GLUCOSE: 111 mg/dL — AB (ref 70–99)
Potassium: 4 mEq/L (ref 3.5–5.1)
Sodium: 140 mEq/L (ref 135–145)
TOTAL PROTEIN: 7.3 g/dL (ref 6.0–8.3)

## 2018-04-29 LAB — TSH: TSH: 3.43 u[IU]/mL (ref 0.35–4.50)

## 2018-04-29 MED ORDER — ATORVASTATIN CALCIUM 20 MG PO TABS: 20.0000 mg | ORAL_TABLET | Freq: Every day | ORAL | 3 refills | Status: DC

## 2018-04-29 MED ORDER — ALBUTEROL SULFATE HFA 108 (90 BASE) MCG/ACT IN AERS: 2.0000 | INHALATION_SPRAY | RESPIRATORY_TRACT | 3 refills | Status: DC | PRN

## 2018-04-29 NOTE — Patient Instructions (Signed)
Great to see you. I will call you with your lab results from today and you can view them online.   Please call the breast center at [phone] to schedule your mammogram and bone density appointments.

## 2018-04-29 NOTE — Assessment & Plan Note (Signed)
Reviewed preventive care protocols, scheduled due services, and updated immunizations Discussed nutrition, exercise, diet, and healthy lifestyle.  Orders Placed This Encounter  Procedures  . MM Digital Screening  . DG Bone Density  . Flu Vaccine QUAD 6+ mos PF IM (Fluarix Quad PF)  . CBC with Differential/Platelet  . Comprehensive metabolic panel  . Lipid panel  . TSH

## 2018-04-29 NOTE — Assessment & Plan Note (Signed)
DEXA ordered.  Continue calcium, vitamin D and weight bearing exercise.

## 2018-04-29 NOTE — Progress Notes (Signed)
Subjective:   Patient ID: Melinda Tran, female    DOB: September 22, 1950, 67 y.o.   MRN: [id number]  Melinda Tran is a pleasant 67 y.o. year old female who presents to clinic today with Annual Exam (Patient is here today for a CPE.  She is needing to have a mammogram and BMD and will call [facility] if a order is placed.  She agrees to get her flu shot today.  She will be due for her PNV-23 on 11.19.19.  She will call insurance to see how much they will cover on Shingrix and will sched nurse visit if covered.  She is currently fasting.)  on 04/29/2018  HPI:  Health Maintenance  Topic Date Due  . MAMMOGRAM  02/06/2016  . DEXA SCAN  04/16/2016  . INFLUENZA VACCINE  03/07/2018  . TETANUS/TDAP  05/29/2018 (Originally 04/16/1970)  . PNA vac Low Risk Adult (2 of 2 - PPSV23) 06/25/2018  . COLONOSCOPY  09/24/2019  . Hepatitis C Screening  Completed   Remote h/o hysterectomy.  HLD- taking lipitor 20 mg daily. Lab Results  Component Value Date   CHOL 196 06/25/2017   HDL 44.10 06/25/2017   LDLCALC 120 (H) 06/25/2017   LDLDIRECT 149.0 12/28/2016   TRIG 161.0 (H) 06/25/2017   CHOLHDL 4 06/25/2017   Lab Results  Component Value Date   ALT 16 06/25/2017   AST 13 06/25/2017   ALKPHOS 89 06/25/2017   BILITOT 0.8 06/25/2017   Allergic rhinitis/asthmatic bronchitis-  On singulair and zyrtec daily ,along with advair. As needed rescue inhaler- rare use.  Current Outpatient Medications on File Prior to Visit  Medication Sig Dispense Refill  . albuterol (PROVENTIL HFA;VENTOLIN HFA) 108 (90 BASE) MCG/ACT inhaler Inhale 2 puffs into the lungs as needed for wheezing or shortness of breath.    . calcium carbonate 200 MG capsule Take 250 mg by mouth daily.    . cetirizine (ZYRTEC) 10 MG tablet Take 10 mg by mouth daily.    . cholecalciferol (VITAMIN D) 1000 UNITS tablet Take 1,000 Units by mouth daily.    Marland Kitchen esomeprazole (NEXIUM) 40 MG capsule Take 40 mg by mouth daily at 12 noon.    .  fluticasone-salmeterol (ADVAIR HFA) 230-21 MCG/ACT inhaler Inhale 2 puffs into the lungs 2 (two) times daily.     . Glucosamine-Chondroitin (GLUCOSAMINE CHONDR COMPLEX PO) Take 2 tablets by mouth daily.    . montelukast (SINGULAIR) 10 MG tablet Take 10 mg by mouth at bedtime.    . Omega-3 1000 MG CAPS Take 2 capsules by mouth daily.    . Turmeric 500 MG CAPS Take by mouth daily.    . vitamin C (ASCORBIC ACID) 500 MG tablet Take 500 mg by mouth daily.     No current facility-administered medications on file prior to visit.     No Known Allergies  Past Medical History:  Diagnosis Date  . Allergy   . Anemia    in the past   . Asthma   . GERD (gastroesophageal reflux disease)   . Hyperlipidemia     Past Surgical History:  Procedure Laterality Date  . ABDOMINAL HYSTERECTOMY    . COLONOSCOPY    . POLYPECTOMY      Family History  Problem Relation Age of Onset  . Colon cancer Mother   . Lung cancer Father   . Rectal cancer Neg Hx   . Stomach cancer Neg Hx     Social History   Socioeconomic History  .  Marital status: Married    Spouse name: Not on file  . Number of children: Not on file  . Years of education: Not on file  . Highest education level: Not on file  Occupational History  . Not on file  Social Needs  . Financial resource strain: Not on file  . Food insecurity:    Worry: Not on file    Inability: Not on file  . Transportation needs:    Medical: Not on file    Non-medical: Not on file  Tobacco Use  . Smoking status: Former Research scientist (life sciences)  . Smokeless tobacco: Never Used  Substance and Sexual Activity  . Alcohol use: Yes    Alcohol/week: 7.0 standard drinks    Types: 7 Standard drinks or equivalent per week    Comment: 1 glass of wine a day   . Drug use: No  . Sexual activity: Not on file  Lifestyle  . Physical activity:    Days per week: Not on file    Minutes per session: Not on file  . Stress: Not on file  Relationships  . Social connections:    Talks  on phone: Not on file    Gets together: Not on file    Attends religious service: Not on file    Active member of club or organization: Not on file    Attends meetings of clubs or organizations: Not on file    Relationship status: Not on file  . Intimate partner violence:    Fear of current or ex partner: Not on file    Emotionally abused: Not on file    Physically abused: Not on file    Forced sexual activity: Not on file  Other Topics Concern  . Not on file  Social History Narrative  . Not on file   The PMH, PSH, Social History, Family History, Medications, and allergies have been reviewed in [facility], and have been updated if relevant.  Review of Systems  Constitutional: Negative.   HENT: Negative.   Eyes: Negative.   Respiratory: Negative.   Cardiovascular: Negative.   Gastrointestinal: Negative.   Endocrine: Negative.   Genitourinary: Negative.   Musculoskeletal: Negative.   Skin: Negative.   Allergic/Immunologic: Negative.   Neurological: Negative.   Hematological: Negative.   Psychiatric/Behavioral: Negative.   All other systems reviewed and are negative.      Objective:    BP 128/80 (BP Location: Left Arm, Patient Position: Sitting, Cuff Size: Normal)   Pulse 66   Temp 98.6 F (37 C) (Oral)   Ht 5' 5.16" (1.655 m)   Wt 200 lb (90.7 kg)   SpO2 96%   BMI 33.12 kg/m    Physical Exam    General:  Well-developed,well-nourished,in no acute distress; alert,appropriate and cooperative throughout examination Head:  normocephalic and atraumatic.   Eyes:  vision grossly intact, PERRL Ears:  R ear normal and L ear normal externally, TMs clear bilaterally Nose:  no external deformity.   Mouth:  good dentition.   Neck:  No deformities, masses, or tenderness noted. Lungs:  Normal respiratory effort, chest expands symmetrically. Lungs are clear to auscultation, no crackles or wheezes. Heart:  Normal rate and regular rhythm. S1 and S2 normal without gallop, murmur,  click, rub or other extra sounds. Abdomen:  Bowel sounds positive,abdomen soft and non-tender without masses, organomegaly or hernias noted. Msk:  No deformity or scoliosis noted of thoracic or lumbar spine.   Extremities:  No clubbing, cyanosis, edema, or deformity noted with  normal full range of motion of all joints.   Neurologic:  alert & oriented X3 and gait normal.   Skin:  Intact without suspicious lesions or rashes Cervical Nodes:  No lymphadenopathy noted Axillary Nodes:  No palpable lymphadenopathy Psych:  Cognition and judgment appear intact. Alert and cooperative with normal attention span and concentration. No apparent delusions, illusions, hallucinations      Assessment & Plan:   Well woman exam without gynecological exam  Hyperlipidemia, unspecified hyperlipidemia type - Plan: CBC with Differential/Platelet, Comprehensive metabolic panel, Lipid panel, TSH  Gastroesophageal reflux disease, esophagitis presence not specified  Estrogen deficiency - Plan: DG Bone Density  Screening for breast cancer - Plan: MM Digital Screening  Asthmatic bronchitis , chronic ([place])  Need for influenza vaccination - Plan: Flu Vaccine QUAD 6+ mos PF IM (Fluarix Quad PF) No follow-ups on file.

## 2018-04-29 NOTE — Assessment & Plan Note (Signed)
Continue lipitor. Labs today.

## 2018-04-29 NOTE — Assessment & Plan Note (Signed)
Well controlled.  No changes made. 

## 2018-04-29 NOTE — Assessment & Plan Note (Signed)
Well controlled on Nexium 

## 2018-10-14 ENCOUNTER — Encounter: Payer: Self-pay | Admitting: Family Medicine

## 2018-10-14 ENCOUNTER — Ambulatory Visit: Payer: Medicare Other | Admitting: Family Medicine

## 2018-10-14 VITALS — BP 130/76 | HR 67 | Temp 97.9°F | Ht 65.16 in | Wt 186.4 lb

## 2018-10-14 DIAGNOSIS — H00011 Hordeolum externum right upper eyelid: Secondary | ICD-10-CM | POA: Diagnosis not present

## 2018-10-14 NOTE — Progress Notes (Signed)
Melinda Tran - 68 y.o. female MRN [id number]  Date of birth: 1950-09-17  SUBJECTIVE:  Including CC & ROS.  Chief Complaint  Patient presents with  . Stye    stye on right eye/been off and on for 2 weeks    Melinda Tran is a 68 y.o. female that is  Presenting with right eyelid lesion. Has been intermittent for the past 2-3 weeks. Has irritation when she wears contacts. Is red and swollen. Has been trying warm compress. No trauma. Has some discharge in the morning. No changes in vision. More itchiness than pain.   Review of Systems  Constitutional: Negative for fever.  HENT: Negative for congestion.   Eyes: Negative for visual disturbance.  Respiratory: Negative for cough.   Cardiovascular: Negative for chest pain.  Gastrointestinal: Negative for abdominal pain.    HISTORY: Past Medical, Surgical, Social, and Family History Reviewed & Updated per EMR.   Pertinent Historical Findings include:  Past Medical History:  Diagnosis Date  . Allergy   . Anemia    in the past   . Asthma   . GERD (gastroesophageal reflux disease)   . Hyperlipidemia     Past Surgical History:  Procedure Laterality Date  . ABDOMINAL HYSTERECTOMY    . COLONOSCOPY    . POLYPECTOMY      No Known Allergies  Family History  Problem Relation Age of Onset  . Colon cancer Mother   . Lung cancer Father   . Rectal cancer Neg Hx   . Stomach cancer Neg Hx      Social History   Socioeconomic History  . Marital status: Married    Spouse name: Not on file  . Number of children: Not on file  . Years of education: Not on file  . Highest education level: Not on file  Occupational History  . Not on file  Social Needs  . Financial resource strain: Not on file  . Food insecurity:    Worry: Not on file    Inability: Not on file  . Transportation needs:    Medical: Not on file    Non-medical: Not on file  Tobacco Use  . Smoking status: Former Research scientist (life sciences)  . Smokeless tobacco: Never Used  Substance and  Sexual Activity  . Alcohol use: Yes    Alcohol/week: 7.0 standard drinks    Types: 7 Standard drinks or equivalent per week    Comment: 1 glass of wine a day   . Drug use: No  . Sexual activity: Not on file  Lifestyle  . Physical activity:    Days per week: Not on file    Minutes per session: Not on file  . Stress: Not on file  Relationships  . Social connections:    Talks on phone: Not on file    Gets together: Not on file    Attends religious service: Not on file    Active member of club or organization: Not on file    Attends meetings of clubs or organizations: Not on file    Relationship status: Not on file  . Intimate partner violence:    Fear of current or ex partner: Not on file    Emotionally abused: Not on file    Physically abused: Not on file    Forced sexual activity: Not on file  Other Topics Concern  . Not on file  Social History Narrative  . Not on file     PHYSICAL EXAM:  VS: BP 130/76  Pulse 67   Temp 97.9 F (36.6 C) (Oral)   Ht 5' 5.16" (1.655 m)   Wt 186 lb 6.4 oz (84.6 kg)   SpO2 95%   BMI 30.87 kg/m  Physical Exam Gen: NAD, alert, cooperative with exam, well-appearing ENT: normal lips, normal nasal mucosa,  Eye: normal EOM, normal conjunctiva and lid on left, upper medial eyelid on right is red and swollen. No discharge, normal conjunctiva on right. No foreign body appreciated.  CV:  no edema, +2 pedal pulses   Resp: no accessory muscle use, non-labored,  Skin: no rashes, no areas of induration  Neuro: normal tone, normal sensation to touch Psych:  normal insight, alert and oriented MSK: normal gait, normal strength      ASSESSMENT & PLAN:   Hordeolum externum of right upper eyelid Has been occurring intermittently over the past 2 weeks.  - counseled on supportive care - if occurring at the end of the week then would send to ophthalmology.

## 2018-10-14 NOTE — Assessment & Plan Note (Signed)
Has been occurring intermittently over the past 2 weeks.  - counseled on supportive care - if occurring at the end of the week then would send to ophthalmology.

## 2018-10-14 NOTE — Patient Instructions (Signed)
Nice to meet you  Please continue the warm compress and massage  If this continues for one more week then send me a MyChart email    Stye  A stye, also known as a hordeolum, is a bump that forms on an eyelid. It may look like a pimple next to the eyelash. A stye can form inside the eyelid (internal stye) or outside the eyelid (external stye). A stye can cause redness, swelling, and pain on the eyelid. Styes are very common. Anyone can get them at any age. They usually occur in just one eye, but you may have more than one in either eye. What are the causes? A stye is caused by an infection. The infection is almost always caused by bacteria called Staphylococcus aureus. This is a common type of bacteria that lives on the skin. An internal stye may result from an infected oil-producing gland inside the eyelid. An external stye may be caused by an infection at the base of the eyelash (hair follicle). What increases the risk? You are more likely to develop a stye if:  You have had a stye before.  You have any of these conditions: ? Diabetes. ? Red, itchy, inflamed eyelids (blepharitis). ? A skin condition such as seborrheic dermatitis or rosacea. ? High fat levels in your blood (lipids). What are the signs or symptoms? The most common symptom of a stye is eyelid pain. Internal styes are more painful than external styes. Other symptoms may include:  Painful swelling of your eyelid.  A scratchy feeling in your eye.  Tearing and redness of your eye.  Pus draining from the stye. How is this diagnosed? Your health care provider may be able to diagnose a stye just by examining your eye. The health care provider may also check to make sure:  You do not have a fever or other signs of a more serious infection.  The infection has not spread to other parts of your eye or areas around your eye. How is this treated? Most styes will clear up in a few days without treatment or with warm compresses  applied to the area. You may need to use antibiotic drops or ointment to treat an infection. In some cases, if your stye does not heal with routine treatment, your health care provider may drain pus from the stye using a thin blade or needle. This may be done if the stye is large, causing a lot of pain, or affecting your vision. Follow these instructions at home:  Take over-the-counter and prescription medicines only as told by your health care provider. This includes eye drops or ointments.  If you were prescribed an antibiotic medicine, apply or use it as told by your health care provider. Do not stop using the antibiotic even if your condition improves.  Apply a warm, wet cloth (warm compress) to your eye for 5-10 minutes, 4 times a day.  Clean the affected eyelid as directed by your health care provider.  Do not wear contact lenses or eye makeup until your stye has healed.  Do not try to pop or drain the stye.  Do not rub your eye. Contact a health care provider if:  You have chills or a fever.  Your stye does not go away after several days.  Your stye affects your vision.  Your eyeball becomes swollen, red, or painful. Get help right away if:  You have pain when moving your eye around. Summary  A stye is a bump that  forms on an eyelid. It may look like a pimple next to the eyelash.  A stye can form inside the eyelid (internal stye) or outside the eyelid (external stye). A stye can cause redness, swelling, and pain on the eyelid.  Your health care provider may be able to diagnose a stye just by examining your eye.  Apply a warm, wet cloth (warm compress) to your eye for 5-10 minutes, 4 times a day. This information is not intended to replace advice given to you by your health care provider. Make sure you discuss any questions you have with your health care provider. Document Released: 05/03/2005 Document Revised: 04/05/2017 Document Reviewed: 04/05/2017 Elsevier  Interactive Patient Education  2019 [employer].

## 2018-12-17 ENCOUNTER — Telehealth: Payer: Self-pay | Admitting: Family Medicine

## 2018-12-17 NOTE — Telephone Encounter (Signed)
Called to see if pt wanted to go ahead and set up next CPE which will come due in September also to let know that we are doing virtual visits if she needs an appt for anything. Voicemail was full.

## 2019-04-21 ENCOUNTER — Telehealth: Payer: Self-pay | Admitting: Family Medicine

## 2019-04-21 NOTE — Telephone Encounter (Signed)
Tried to call based on message sent in,  Appointment Request From: [name]    With Provider: Arnette Norris, MD [[facility]]    Preferred Date Range: 04/22/2019 - 04/24/2019    Preferred Times: Any Time    Reason for visit: Flu Shot    Comments:  Flu shot   Unable to leave a message

## 2019-04-23 NOTE — Telephone Encounter (Signed)
I tried again today at 9:35 but the voicemail is still full. I sent a mychart message.

## 2019-04-29 ENCOUNTER — Other Ambulatory Visit: Payer: Self-pay

## 2019-04-29 ENCOUNTER — Ambulatory Visit (INDEPENDENT_AMBULATORY_CARE_PROVIDER_SITE_OTHER): Payer: Medicare Other | Admitting: Behavioral Health

## 2019-04-29 DIAGNOSIS — Z23 Encounter for immunization: Secondary | ICD-10-CM

## 2019-04-29 NOTE — Progress Notes (Signed)
Patient presents in clinic today for Influenza vaccination. IM injection was given in the left deltoid. Patient tolerated the injection well. No signs or symptoms of a reaction were noted prior to patient leaving the nurse visit. 

## 2019-06-19 ENCOUNTER — Telehealth: Payer: Medicare Other | Admitting: Family Medicine

## 2019-06-19 ENCOUNTER — Other Ambulatory Visit: Payer: Self-pay

## 2019-06-19 DIAGNOSIS — J449 Chronic obstructive pulmonary disease, unspecified: Secondary | ICD-10-CM

## 2019-06-19 DIAGNOSIS — J4530 Mild persistent asthma, uncomplicated: Secondary | ICD-10-CM

## 2019-06-19 DIAGNOSIS — K219 Gastro-esophageal reflux disease without esophagitis: Secondary | ICD-10-CM

## 2019-06-19 NOTE — Progress Notes (Signed)
Virtual Visit via Video   Due to the COVID-19 pandemic, this visit was completed with telemedicine (audio/video) technology to reduce patient and provider exposure as well as to preserve personal protective equipment.   I connected with Melinda Tran by a video enabled telemedicine application and verified that I am speaking with the correct person using two identifiers. Location patient: Home Location provider: [facility], Office Persons participating in the virtual visit: Hosie Spangle, MD   I discussed the limitations of evaluation and management by telemedicine and the availability of in person appointments. The patient expressed understanding and agreed to proceed.  Care Team   Patient Care Team: Lucille Passy, MD as PCP - General (Family Medicine) Mosetta Anis, MD as Referring Physician (Allergy)  Subjective:   HPI:   Chronic cough- She is followed by an allergist, Dr. Donneta Romberg. She was last seen by Dr. Donneta Romberg on 07/02/18.  Note reviewed.  She has h/o allergic rhinitis. Mild persistent asthma and GERD. At that [place] she had no complaints and it does not appear that changes were made to her medications.  She is currently taking:  Advair diskcus ProAir respiclick Cholesterol Lipitor Zyrtec Nexium  Attempted to call pt 6 times- goes straight to voicemail.    ROS   Patient Active Problem List   Diagnosis Date Noted  . Hordeolum externum of right upper eyelid 10/14/2018  . Well woman exam without gynecological exam 04/29/2018  . Estrogen deficiency 04/29/2018  . Asthmatic bronchitis , chronic ([place]) 04/29/2018  . GERD (gastroesophageal reflux disease) 05/27/2015  . HLD (hyperlipidemia) 06/11/2014  . Allergic asthma 04/16/2014  . Family history of colon cancer in mother 04/16/2014  . Disorder of bone and cartilage 07/10/2007    Social History   Tobacco Use  . Smoking status: Former Research scientist (life sciences)  . Smokeless tobacco: Never Used  Substance Use Topics  .  Alcohol use: Yes    Alcohol/week: 7.0 standard drinks    Types: 7 Standard drinks or equivalent per week    Comment: 1 glass of wine a day     Current Outpatient Medications:  .  albuterol (PROVENTIL HFA;VENTOLIN HFA) 108 (90 Base) MCG/ACT inhaler, Inhale 2 puffs into the lungs as needed for wheezing or shortness of breath., Disp: 1 Inhaler, Rfl: 3 .  atorvastatin (LIPITOR) 20 MG tablet, Take 1 tablet (20 mg total) by mouth daily., Disp: 90 tablet, Rfl: 3 .  calcium carbonate 200 MG capsule, Take 250 mg by mouth daily., Disp: , Rfl:  .  cetirizine (ZYRTEC) 10 MG tablet, Take 10 mg by mouth daily., Disp: , Rfl:  .  cholecalciferol (VITAMIN D) 1000 UNITS tablet, Take 1,000 Units by mouth daily., Disp: , Rfl:  .  esomeprazole (NEXIUM) 40 MG capsule, Take 40 mg by mouth daily at 12 noon., Disp: , Rfl:  .  fluticasone-salmeterol (ADVAIR HFA) 230-21 MCG/ACT inhaler, Inhale 2 puffs into the lungs 2 (two) times daily. , Disp: , Rfl:  .  Glucosamine-Chondroitin (GLUCOSAMINE CHONDR COMPLEX PO), Take 2 tablets by mouth daily., Disp: , Rfl:  .  montelukast (SINGULAIR) 10 MG tablet, Take 10 mg by mouth at bedtime., Disp: , Rfl:  .  Omega-3 1000 MG CAPS, Take 2 capsules by mouth daily., Disp: , Rfl:  .  Turmeric 500 MG CAPS, Take by mouth daily., Disp: , Rfl:  .  vitamin C (ASCORBIC ACID) 500 MG tablet, Take 500 mg by mouth daily., Disp: , Rfl:   No Known Allergies  Objective:   VITALS: Per patient if applicable, see vitals. GENERAL: Alert, appears well and in no acute distress. HEENT: Atraumatic, conjunctiva clear, no obvious abnormalities on inspection of external nose and ears. NECK: Normal movements of the head and neck. CARDIOPULMONARY: No increased WOB. Speaking in clear sentences. I:E ratio WNL.  MS: Moves all visible extremities without noticeable abnormality. PSYCH: Pleasant and cooperative, well-groomed. Speech normal rate and rhythm. Affect is appropriate. Insight and judgement are  appropriate. Attention is focused, linear, and appropriate.  NEURO: CN grossly intact. Oriented as arrived to appointment on time with no prompting. Moves both UE equally.  SKIN: No obvious lesions, wounds, erythema, or cyanosis noted on face or hands.  Depression screen [facility] 2/9 06/25/2017 11/25/2015  Decreased Interest 0 0  Down, Depressed, Hopeless 0 0  PHQ - 2 Score 0 0    Assessment and Plan:   There are no diagnoses linked to this encounter.  Marland Kitchen COVID-19 Education: The signs and symptoms of COVID-19 were discussed with the patient and how to seek care for testing if needed. The importance of social distancing was discussed today. . Reviewed expectations re: course of current medical issues. . Discussed self-management of symptoms. . Outlined signs and symptoms indicating need for more acute intervention. . Patient verbalized understanding and all questions were answered. Marland Kitchen Health Maintenance issues including appropriate healthy diet, exercise, and smoking avoidance were discussed with patient. . See orders for this visit as documented in the electronic medical record.  Arnette Norris, MD  Records requested if needed. Time spent: 20 minutes, of which >50% was spent in obtaining information about her symptoms, reviewing her previous labs, evaluations, and treatments, counseling her about her condition (please see the discussed topics above), and developing a plan to further investigate it; she had a number of questions which I addressed.

## 2019-06-20 ENCOUNTER — Other Ambulatory Visit: Payer: Self-pay

## 2019-06-20 ENCOUNTER — Encounter: Payer: Self-pay | Admitting: Family Medicine

## 2019-06-20 ENCOUNTER — Ambulatory Visit (INDEPENDENT_AMBULATORY_CARE_PROVIDER_SITE_OTHER): Payer: Medicare Other | Admitting: Family Medicine

## 2019-06-20 DIAGNOSIS — J45901 Unspecified asthma with (acute) exacerbation: Secondary | ICD-10-CM | POA: Insufficient documentation

## 2019-06-20 DIAGNOSIS — J4541 Moderate persistent asthma with (acute) exacerbation: Secondary | ICD-10-CM | POA: Diagnosis not present

## 2019-06-20 MED ORDER — PREDNISONE 20 MG PO TABS: 20.0000 mg | ORAL_TABLET | Freq: Every day | ORAL | 0 refills | Status: AC

## 2019-06-20 MED ORDER — DOXYCYCLINE HYCLATE 100 MG PO TABS: 100.0000 mg | ORAL_TABLET | Freq: Two times a day (BID) | ORAL | 0 refills | Status: DC

## 2019-06-20 NOTE — Progress Notes (Signed)
Melinda Tran - 68 y.o. female MRN [id number]  Date of birth: 1951-03-29   This visit type was conducted due to national recommendations for restrictions regarding the COVID-19 Pandemic (e.g. social distancing).  This format is felt to be most appropriate for this patient at this time.  All issues noted in this document were discussed and addressed.  No physical exam was performed (except for noted visual exam findings with Video Visits).  I discussed the limitations of evaluation and management by telemedicine and the availability of in person appointments. The patient expressed understanding and agreed to proceed.  I connected with@ on 06/20/19 at  8:40 AM EST by a video enabled telemedicine application and verified that I am speaking with the correct person using two identifiers.  Present for visit:  Melinda Tran   Patient Location: Home [address] [place] [address]   Provider location:   [place]  Chief Complaint  Patient presents with  . Cough    pt had a cough in sept that went away but came bacc halloween now having clear mucus from throat and nose.    HPI  Melinda Tran is a 68 y.o. female who presents via audio/video conferencing for a telehealth visit today.  She has complaint of acute exacerbation of chronic cough.  She has history of asthma and allergies as well as GERD. She is followed by an allergist and prescribed singulair, advair and albuterol for management of asthma.  She reports she has had chronic cough since exposure to URI from granddaughter in September.  Mild flare around halloween due to being around a camp fire but over the past few days had had significant worsening with wheezing and yellow sputum.  She denies fever, body aches, headaches, rash, nausea, vomiting, diarrhea with this.     ROS:  A comprehensive ROS was completed and negative except as noted per HPI  Past Medical History:  Diagnosis Date  . Allergy   .  Anemia    in the past   . Asthma   . GERD (gastroesophageal reflux disease)   . Hyperlipidemia     Past Surgical History:  Procedure Laterality Date  . ABDOMINAL HYSTERECTOMY    . COLONOSCOPY    . POLYPECTOMY      Family History  Problem Relation Age of Onset  . Colon cancer Mother   . Lung cancer Father   . Rectal cancer Neg Hx   . Stomach cancer Neg Hx     Social History   Socioeconomic History  . Marital status: Married    Spouse name: Not on file  . Number of children: Not on file  . Years of education: Not on file  . Highest education level: Not on file  Occupational History  . Not on file  Social Needs  . Financial resource strain: Not on file  . Food insecurity    Worry: Not on file    Inability: Not on file  . Transportation needs    Medical: Not on file    Non-medical: Not on file  Tobacco Use  . Smoking status: Former Research scientist (life sciences)  . Smokeless tobacco: Never Used  Substance and Sexual Activity  . Alcohol use: Yes    Alcohol/week: 7.0 standard drinks    Types: 7 Standard drinks or equivalent per week    Comment: 1 glass of wine a day   . Drug use: No  . Sexual activity: Not on file  Lifestyle  .  Physical activity    Days per week: Not on file    Minutes per session: Not on file  . Stress: Not on file  Relationships  . Social Herbalist on phone: Not on file    Gets together: Not on file    Attends religious service: Not on file    Active member of club or organization: Not on file    Attends meetings of clubs or organizations: Not on file    Relationship status: Not on file  . Intimate partner violence    Fear of current or ex partner: Not on file    Emotionally abused: Not on file    Physically abused: Not on file    Forced sexual activity: Not on file  Other Topics Concern  . Not on file  Social History Narrative  . Not on file     Current Outpatient Medications:  .  albuterol (PROVENTIL HFA;VENTOLIN HFA) 108 (90 Base) MCG/ACT  inhaler, Inhale 2 puffs into the lungs as needed for wheezing or shortness of breath., Disp: 1 Inhaler, Rfl: 3 .  atorvastatin (LIPITOR) 20 MG tablet, Take 1 tablet (20 mg total) by mouth daily., Disp: 90 tablet, Rfl: 3 .  calcium carbonate 200 MG capsule, Take 250 mg by mouth daily., Disp: , Rfl:  .  cetirizine (ZYRTEC) 10 MG tablet, Take 10 mg by mouth daily., Disp: , Rfl:  .  cholecalciferol (VITAMIN D) 1000 UNITS tablet, Take 1,000 Units by mouth daily., Disp: , Rfl:  .  EPIPEN 2-PAK 0.3 MG/0.3ML SOAJ injection, See admin instructions., Disp: , Rfl:  .  esomeprazole (NEXIUM) 40 MG capsule, Take 40 mg by mouth daily at 12 noon., Disp: , Rfl:  .  fluticasone-salmeterol (ADVAIR HFA) 230-21 MCG/ACT inhaler, Inhale 2 puffs into the lungs 2 (two) times daily. , Disp: , Rfl:  .  Glucosamine-Chondroitin (GLUCOSAMINE CHONDR COMPLEX PO), Take 2 tablets by mouth daily., Disp: , Rfl:  .  montelukast (SINGULAIR) 10 MG tablet, Take 10 mg by mouth at bedtime., Disp: , Rfl:  .  Omega-3 1000 MG CAPS, Take 2 capsules by mouth daily., Disp: , Rfl:  .  Turmeric 500 MG CAPS, Take by mouth daily., Disp: , Rfl:  .  vitamin C (ASCORBIC ACID) 500 MG tablet, Take 500 mg by mouth daily., Disp: , Rfl:  .  doxycycline (VIBRA-TABS) 100 MG tablet, Take 1 tablet (100 mg total) by mouth 2 (two) times daily., Disp: 20 tablet, Rfl: 0 .  predniSONE (DELTASONE) 20 MG tablet, Take 1 tablet (20 mg total) by mouth daily with breakfast for 5 days., Disp: 5 tablet, Rfl: 0  EXAM:  VITALS per patient if applicable: Temp 0000000 F (37 C) (Oral) Comment: per pt. Comment (Src): per pt  Ht 5\' 5"  (1.651 m)   BMI 31.02 kg/m   GENERAL: alert, oriented, appears well and in no acute distress  HEENT: atraumatic, conjunttiva clear, no obvious abnormalities on inspection of external nose and ears  NECK: normal movements of the head and neck  LUNGS: on inspection no signs of respiratory distress, breathing rate appears normal, no obvious  gross SOB, gasping or wheezing  CV: no obvious cyanosis  MS: moves all visible extremities without noticeable abnormality  PSYCH/NEURO: pleasant and cooperative, no obvious depression or anxiety, speech and thought processing grossly intact  ASSESSMENT AND PLAN:  Discussed the following assessment and plan:  Asthma exacerbation -With acute worsening will treat with course of doxycycline and steroids.  She  declines high dose steroid but is ok with taking 20mg /day x5 days.   -Continue current inhaler and singulair -She is instructed to contact us for any new or worsening symptoms.  She understands and agrees with plan.        I discussed the assessment and treatment plan with the patient. The patient was provided an opportunity to ask questions and all were answered. The patient agreed with the plan and demonstrated an understanding of the instructions.   The patient was advised to call back or seek an in-person evaluation if the symptoms worsen or if the condition fails to improve as anticipated.     Luetta Nutting, DO

## 2019-06-20 NOTE — Assessment & Plan Note (Signed)
-  With acute worsening will treat with course of doxycycline and steroids.  She declines high dose steroid but is ok with taking 20mg /day x5 days.   -Continue current inhaler and singulair -She is instructed to contact us for any new or worsening symptoms.  She understands and agrees with plan.

## 2019-07-08 NOTE — Progress Notes (Signed)
Virtual Visit via Video Note  I connected with patient on 07/09/19 at 11:45 AM EST by audio enabled telemedicine application and verified that I am speaking with the correct person using two identifiers.   THIS ENCOUNTER IS A VIRTUAL VISIT DUE TO COVID-19 - PATIENT WAS NOT SEEN IN THE OFFICE. PATIENT HAS CONSENTED TO VIRTUAL VISIT / TELEMEDICINE VISIT   Location of patient: home  Location of provider: office  I discussed the limitations of evaluation and management by telemedicine and the availability of in person appointments. The patient expressed understanding and agreed to proceed.   Subjective:   Melinda Tran is a 68 y.o. female who presents for an Initial Medicare Annual Wellness Visit.  The Patient was informed that the wellness visit is to identify future health risk and educate and initiate measures that can reduce risk for increased disease through the lifespan.   Describes health as fair, good or great? Very good!  Retired Education officer, museum.   Review of Systems    Home Safety/Smoke Alarms: Feels safe in home. Smoke alarms in place.  Lives w/ husband. 1 story home.  Female:       Mammo- declines at this time d/t covid      Dexa scan- declines at this time d/t covid     [facility]- 09/23/14. Recall 5 yrs.    Objective:     Advanced Directives 07/09/2019 09/09/2014  Does Patient Have a Medical Advance Directive? Yes Yes  Type of Paramedic of [place];Living will [place];Living will  Does patient want to make changes to medical advance directive? No - Patient declined -  Copy of [place] in Chart? Yes - validated most recent copy scanned in chart (See row information) -    Current Medications (verified) Outpatient Encounter Medications as of 07/09/2019  Medication Sig  . albuterol (PROVENTIL HFA;VENTOLIN HFA) 108 (90 Base) MCG/ACT inhaler Inhale 2 puffs into the lungs as needed for wheezing or shortness of  breath.  Marland Kitchen atorvastatin (LIPITOR) 20 MG tablet Take 1 tablet (20 mg total) by mouth daily.  . calcium carbonate 200 MG capsule Take 250 mg by mouth daily.  . cetirizine (ZYRTEC) 10 MG tablet Take 10 mg by mouth daily.  . cholecalciferol (VITAMIN D) 1000 UNITS tablet Take 1,000 Units by mouth daily.  Marland Kitchen EPIPEN 2-PAK 0.3 MG/0.3ML SOAJ injection See admin instructions.  Marland Kitchen esomeprazole (NEXIUM) 40 MG capsule Take 40 mg by mouth daily at 12 noon.  . fluticasone-salmeterol (ADVAIR HFA) 230-21 MCG/ACT inhaler Inhale 2 puffs into the lungs 2 (two) times daily.   . Glucosamine-Chondroitin (GLUCOSAMINE CHONDR COMPLEX PO) Take 2 tablets by mouth daily.  . montelukast (SINGULAIR) 10 MG tablet Take 10 mg by mouth at bedtime.  . Omega-3 1000 MG CAPS Take 2 capsules by mouth daily.  . Turmeric 500 MG CAPS Take by mouth daily.  . vitamin C (ASCORBIC ACID) 500 MG tablet Take 500 mg by mouth daily.  . [DISCONTINUED] doxycycline (VIBRA-TABS) 100 MG tablet Take 1 tablet (100 mg total) by mouth 2 (two) times daily.   No facility-administered encounter medications on file as of 07/09/2019.     Allergies (verified) Patient has no known allergies.   History: Past Medical History:  Diagnosis Date  . Allergy   . Anemia    in the past   . Asthma   . GERD (gastroesophageal reflux disease)   . Hyperlipidemia    Past Surgical History:  Procedure Laterality Date  .  ABDOMINAL HYSTERECTOMY    . COLONOSCOPY    . POLYPECTOMY     Family History  Problem Relation Age of Onset  . Colon cancer Mother   . Lung cancer Father   . Rectal cancer Neg Hx   . Stomach cancer Neg Hx    Social History   Socioeconomic History  . Marital status: Married    Spouse name: Not on file  . Number of children: Not on file  . Years of education: Not on file  . Highest education level: Not on file  Occupational History  . Not on file  Social Needs  . Financial resource strain: Not on file  . Food insecurity    Worry: Not  on file    Inability: Not on file  . Transportation needs    Medical: Not on file    Non-medical: Not on file  Tobacco Use  . Smoking status: Former Research scientist (life sciences)  . Smokeless tobacco: Never Used  Substance and Sexual Activity  . Alcohol use: Yes    Alcohol/week: 7.0 standard drinks    Types: 7 Standard drinks or equivalent per week    Comment: 1 glass of wine a day   . Drug use: No  . Sexual activity: Not on file  Lifestyle  . Physical activity    Days per week: Not on file    Minutes per session: Not on file  . Stress: Not on file  Relationships  . Social Herbalist on phone: Not on file    Gets together: Not on file    Attends religious service: Not on file    Active member of club or organization: Not on file    Attends meetings of clubs or organizations: Not on file    Relationship status: Not on file  Other Topics Concern  . Not on file  Social History Narrative  . Not on file    Tobacco Counseling Counseling given: Not Answered   Clinical Intake: Pain : No/denies pain    Activities of Daily Living In your present state of health, do you have any difficulty performing the following activities: 07/09/2019  Hearing? N  Vision? N  Difficulty concentrating or making decisions? N  Walking or climbing stairs? N  Dressing or bathing? N  Doing errands, shopping? N  Preparing Food and eating ? N  Using the Toilet? N  In the past six months, have you accidently leaked urine? N  Do you have problems with loss of bowel control? N  Managing your Medications? N  Managing your Finances? N  Housekeeping or managing your Housekeeping? N  Some recent data might be hidden     Immunizations and Health Maintenance Immunization History  Administered Date(s) Administered  . Fluad Quad(high Dose 65+) 04/29/2019  . Influenza Split 06/02/2011  . Influenza Whole 06/14/2007, 05/26/2013  . Influenza,inj,Quad PF,6+ Mos 04/16/2014, 05/27/2015, 05/23/2016, 05/17/2017,  04/29/2018  . Pneumococcal Conjugate-13 06/25/2017  . Pneumococcal Polysaccharide-23 08/07/2006  . Zoster 04/16/2014   Health Maintenance Due  Topic Date Due  . TETANUS/TDAP  04/16/1970  . MAMMOGRAM  02/06/2016  . DEXA SCAN  04/16/2016  . PNA vac Low Risk Adult (2 of 2 - PPSV23) 06/25/2018    Patient Care Team: Lucille Passy, MD as PCP - General (Family Medicine) Mosetta Anis, MD as Referring Physician (Allergy)  Indicate any recent Medical Services you may have received from other than [facility] providers in the past year (date may be approximate).  Assessment:   This is a routine wellness examination for [facility] Physical assessment deferred to PCP.  Hearing/Vision screen Unable to assess. This visit is enabled though telemedicine due to Covid 19.   Dietary issues and exercise activities discussed: Current Exercise Habits: Home exercise routine, Type of exercise: walking, Time (Minutes): 45, Frequency (Times/Week): 7, Weekly Exercise (Minutes/Week): 315, Exercise limited by: None identified Diet (meal preparation, eat out, water intake, caffeinated beverages, dairy products, fruits and vegetables): strict weight watchers    Goals   None    Depression Screen PHQ 2/9 Scores 06/25/2017 11/25/2015  PHQ - 2 Score 0 0    Fall Risk Fall Risk  07/09/2019 06/25/2017  Falls in the past year? 0 No  Follow up Education provided;Falls prevention discussed -    Cognitive Function: Ad8 score reviewed for issues:  Issues making decisions:no  Less interest in hobbies / activities:no  Repeats questions, stories (family complaining):no  Trouble using ordinary gadgets (microwave, computer, phone):no  Forgets the month or year: no  Mismanaging finances: no  Remembering appts:no  Daily problems with thinking and/or memory:no Ad8 score is=0         Screening Tests Health Maintenance  Topic Date Due  . TETANUS/TDAP  04/16/1970  . MAMMOGRAM  02/06/2016  . DEXA SCAN   04/16/2016  . PNA vac Low Risk Adult (2 of 2 - PPSV23) 06/25/2018  . COLONOSCOPY  09/24/2019  . INFLUENZA VACCINE  Completed  . Hepatitis C Screening  Completed      Plan:   See you next year!  Continue to eat heart healthy diet (full of fruits, vegetables, whole grains, lean protein, water--limit salt, fat, and sugar intake) and increase physical activity as tolerated.  Continue doing brain stimulating activities (puzzles, reading, adult coloring books, staying active) to keep memory sharp.   Bring a copy of your living will and/or healthcare power of attorney to your next office visit.    I have personally reviewed and noted the following in the patient's chart:   . Medical and social history . Use of alcohol, tobacco or illicit drugs  . Current medications and supplements . Functional ability and status . Nutritional status . Physical activity . Advanced directives . List of other physicians . Hospitalizations, surgeries, and ER visits in previous 12 months . Vitals . Screenings to include cognitive, depression, and falls . Referrals and appointments  In addition, I have reviewed and discussed with patient certain preventive protocols, quality metrics, and best practice recommendations. A written personalized care plan for preventive services as well as general preventive health recommendations were provided to patient.     Shela Nevin, [place]   07/09/2019

## 2019-07-09 ENCOUNTER — Ambulatory Visit (INDEPENDENT_AMBULATORY_CARE_PROVIDER_SITE_OTHER): Payer: Medicare Other | Admitting: *Deleted

## 2019-07-09 ENCOUNTER — Encounter: Payer: Self-pay | Admitting: *Deleted

## 2019-07-09 DIAGNOSIS — Z0000 Encounter for general adult medical examination without abnormal findings: Secondary | ICD-10-CM | POA: Diagnosis not present

## 2019-07-09 NOTE — Patient Instructions (Signed)
See you next year!  Continue to eat heart healthy diet (full of fruits, vegetables, whole grains, lean protein, water--limit salt, fat, and sugar intake) and increase physical activity as tolerated.  Continue doing brain stimulating activities (puzzles, reading, adult coloring books, staying active) to keep memory sharp.   Bring a copy of your living will and/or healthcare power of attorney to your next office visit.   Melinda Tran , Thank you for taking time to come for your Medicare Wellness Visit. I appreciate your ongoing commitment to your health goals. Please review the following plan we discussed and let me know if I can assist you in the future.   These are the goals we discussed: Goals    . Patient Stated     Continue with weight watchers and exercising daily       This is a list of the screening recommended for you and due dates:  Health Maintenance  Topic Date Due  . Tetanus Vaccine  04/16/1970  . Mammogram  02/06/2016  . DEXA scan (bone density measurement)  04/16/2016  . Pneumonia vaccines (2 of 2 - PPSV23) 06/25/2018  . Colon Cancer Screening  09/24/2019  . Flu Shot  Completed  .  Hepatitis C: One time screening is recommended by Center for Disease Control  (CDC) for  adults born from [birth year] through 1965.   Completed    Preventive Care 64 Years and Older, Female Preventive care refers to lifestyle choices and visits with your health care provider that can promote health and wellness. This includes:  A yearly physical exam. This is also called an annual well check.  Regular dental and eye exams.  Immunizations.  Screening for certain conditions.  Healthy lifestyle choices, such as diet and exercise. What can I expect for my preventive care visit? Physical exam Your health care provider will check:  Height and weight. These may be used to calculate body mass index (BMI), which is a measurement that tells if you are at a healthy weight.  Heart rate and blood  pressure.  Your skin for abnormal spots. Counseling Your health care provider may ask you questions about:  Alcohol, tobacco, and drug use.  Emotional well-being.  Home and relationship well-being.  Sexual activity.  Eating habits.  History of falls.  Memory and ability to understand (cognition).  Work and work Statistician.  Pregnancy and menstrual history. What immunizations do I need?  Influenza (flu) vaccine  This is recommended every year. Tetanus, diphtheria, and pertussis (Tdap) vaccine  You may need a Td booster every 10 years. Varicella (chickenpox) vaccine  You may need this vaccine if you have not already been vaccinated. Zoster (shingles) vaccine  You may need this after age 54. Pneumococcal conjugate (PCV13) vaccine  One dose is recommended after age 26. Pneumococcal polysaccharide (PPSV23) vaccine  One dose is recommended after age 55. Measles, mumps, and rubella (MMR) vaccine  You may need at least one dose of MMR if you were born in 1957 or later. You may also need a second dose. Meningococcal conjugate (MenACWY) vaccine  You may need this if you have certain conditions. Hepatitis A vaccine  You may need this if you have certain conditions or if you travel or work in places where you may be exposed to hepatitis A. Hepatitis B vaccine  You may need this if you have certain conditions or if you travel or work in places where you may be exposed to hepatitis B. Haemophilus influenzae type b (Hib) vaccine  You may need this if you have certain conditions. You may receive vaccines as individual doses or as more than one vaccine together in one shot (combination vaccines). Talk with your health care provider about the risks and benefits of combination vaccines. What tests do I need? Blood tests  Lipid and cholesterol levels. These may be checked every 5 years, or more frequently depending on your overall health.  Hepatitis C test.  Hepatitis B  test. Screening  Lung cancer screening. You may have this screening every year starting at age 75 if you have a 30-pack-year history of smoking and currently smoke or have quit within the past 15 years.  Colorectal cancer screening. All adults should have this screening starting at age 8 and continuing until age 59. Your health care provider may recommend screening at age 56 if you are at increased risk. You will have tests every 1-10 years, depending on your results and the type of screening test.  Diabetes screening. This is done by checking your blood sugar (glucose) after you have not eaten for a while (fasting). You may have this done every 1-3 years.  Mammogram. This may be done every 1-2 years. Talk with your health care provider about how often you should have regular mammograms.  BRCA-related cancer screening. This may be done if you have a family history of breast, ovarian, tubal, or peritoneal cancers. Other tests  Sexually transmitted disease (STD) testing.  Bone density scan. This is done to screen for osteoporosis. You may have this done starting at age 48. Follow these instructions at home: Eating and drinking  Eat a diet that includes fresh fruits and vegetables, whole grains, lean protein, and low-fat dairy products. Limit your intake of foods with high amounts of sugar, saturated fats, and salt.  Take vitamin and mineral supplements as recommended by your health care provider.  Do not drink alcohol if your health care provider tells you not to drink.  If you drink alcohol: ? Limit how much you have to 0-1 drink a day. ? Be aware of how much alcohol is in your drink. In the [place] one drink equals one 12 oz bottle of beer (355 mL), one 5 oz glass of wine (148 mL), or one 1 oz glass of hard liquor (44 mL). Lifestyle  Take daily care of your teeth and gums.  Stay active. Exercise for at least 30 minutes on 5 or more days each week.  Do not use any products that  contain nicotine or tobacco, such as cigarettes, e-cigarettes, and chewing tobacco. If you need help quitting, ask your health care provider.  If you are sexually active, practice safe sex. Use a condom or other form of protection in order to prevent STIs (sexually transmitted infections).  Talk with your health care provider about taking a low-dose aspirin or statin. What's next?  Go to your health care provider once a year for a well check visit.  Ask your health care provider how often you should have your eyes and teeth checked.  Stay up to date on all vaccines. This information is not intended to replace advice given to you by your health care provider. Make sure you discuss any questions you have with your health care provider. Document Released: 08/20/2015 Document Revised: 07/18/2018 Document Reviewed: 07/18/2018 Elsevier Patient Education  2020 [employer].

## 2019-07-10 ENCOUNTER — Other Ambulatory Visit: Payer: Self-pay | Admitting: Family Medicine

## 2019-07-10 ENCOUNTER — Encounter: Payer: Self-pay | Admitting: Family Medicine

## 2019-07-10 MED ORDER — HYDROCODONE-HOMATROPINE 5-1.5 MG/5ML PO SYRP: 5.0000 mL | ORAL_SOLUTION | Freq: Every evening | ORAL | 0 refills | Status: DC | PRN

## 2019-07-21 ENCOUNTER — Encounter: Payer: Self-pay | Admitting: Family Medicine

## 2019-07-22 ENCOUNTER — Encounter: Payer: Self-pay | Admitting: Family Medicine

## 2019-07-23 ENCOUNTER — Other Ambulatory Visit: Payer: Self-pay

## 2019-07-23 ENCOUNTER — Ambulatory Visit: Payer: Medicare Other | Attending: Internal Medicine

## 2019-07-23 DIAGNOSIS — R05 Cough: Secondary | ICD-10-CM | POA: Insufficient documentation

## 2019-07-23 DIAGNOSIS — R053 Chronic cough: Secondary | ICD-10-CM | POA: Insufficient documentation

## 2019-07-23 DIAGNOSIS — Z20822 Contact with and (suspected) exposure to covid-19: Secondary | ICD-10-CM

## 2019-07-23 MED ORDER — ATORVASTATIN CALCIUM 20 MG PO TABS: 20.0000 mg | ORAL_TABLET | Freq: Every day | ORAL | 3 refills | Status: DC

## 2019-07-23 NOTE — Progress Notes (Signed)
Virtual Visit via Video   Due to the COVID-19 pandemic, this visit was completed with telemedicine (audio/video) technology to reduce patient and provider exposure as well as to preserve personal protective equipment.   I connected with Melinda Tran by a video enabled telemedicine application and verified that I am speaking with the correct person using two identifiers. Location patient: Home Location provider: [facility], Office Persons participating in the virtual visit: Hosie Spangle, MD   I discussed the limitations of evaluation and management by telemedicine and the availability of in person appointments. The patient expressed understanding and agreed to proceed.  Care Team   Patient Care Team: Lucille Passy, MD as PCP - General (Family Medicine) Mosetta Anis, MD as Referring Physician (Allergy)  Subjective:   HPI: Patient is connecting to discuss a chronic cough x 2 months without any fever, chills or sore throat.  Patient explains that she is coughing up clear mucus but now becoming green or yellow.  She is currently taking Hydrocodone-homatropine syrup x 13days, with relief during the night. She has been afebrile.   She went for a COVID test today. She has tried several other cough meds which either keep her up at night or are ineffective. She was seen by Dr. Zigmund Daniel on 11.13.20 who Rx'ed Doxy and a low-does taper of Prednisone.  She felt neither the prednisone and doxycyline helped.  To continue Albuterol, Singulair.   She does have Dr. Donneta Romberg as an allergist but she last saw him on 11.26.20- he gave her another round of prednisone.  Review of Systems  Constitutional: Negative for chills, fever and malaise/fatigue.  HENT: Positive for congestion. Negative for hearing loss, sinus pain and sore throat.   Eyes: Negative for discharge and redness.  Respiratory: Positive for cough and sputum production. Negative for hemoptysis, shortness of breath and wheezing.    Cardiovascular: Negative for chest pain.  Gastrointestinal: Negative for abdominal pain and heartburn.  Genitourinary: Negative for dysuria.  Musculoskeletal: Negative for joint pain.  Skin: Negative for rash.  Neurological: Negative for dizziness and loss of consciousness.  Endo/Heme/Allergies: Does not bruise/bleed easily.  Psychiatric/Behavioral: Negative for depression and memory loss.     Patient Active Problem List   Diagnosis Date Noted  . Chronic cough 07/23/2019  . Asthma exacerbation 06/20/2019  . Hordeolum externum of right upper eyelid 10/14/2018  . Well woman exam without gynecological exam 04/29/2018  . Estrogen deficiency 04/29/2018  . Asthmatic bronchitis , chronic ([place]) 04/29/2018  . GERD (gastroesophageal reflux disease) 05/27/2015  . HLD (hyperlipidemia) 06/11/2014  . Allergic asthma 04/16/2014  . Family history of colon cancer in mother 04/16/2014  . Disorder of bone and cartilage 07/10/2007    Social History   Tobacco Use  . Smoking status: Former Research scientist (life sciences)  . Smokeless tobacco: Never Used  Substance Use Topics  . Alcohol use: Yes    Alcohol/week: 7.0 standard drinks    Types: 7 Standard drinks or equivalent per week    Comment: 1 glass of wine a day     Current Outpatient Medications:  .  albuterol (PROVENTIL HFA;VENTOLIN HFA) 108 (90 Base) MCG/ACT inhaler, Inhale 2 puffs into the lungs as needed for wheezing or shortness of breath., Disp: 1 Inhaler, Rfl: 3 .  atorvastatin (LIPITOR) 20 MG tablet, Take 1 tablet (20 mg total) by mouth daily., Disp: 90 tablet, Rfl: 3 .  calcium carbonate 200 MG capsule, Take 250 mg by mouth daily., Disp: , Rfl:  .  cetirizine (ZYRTEC) 10 MG tablet, Take 10 mg by mouth daily., Disp: , Rfl:  .  cholecalciferol (VITAMIN D) 1000 UNITS tablet, Take 1,000 Units by mouth daily., Disp: , Rfl:  .  EPIPEN 2-PAK 0.3 MG/0.3ML SOAJ injection, See admin instructions., Disp: , Rfl:  .  esomeprazole (NEXIUM) 40 MG capsule, Take 40 mg by  mouth daily at 12 noon., Disp: , Rfl:  .  fluticasone-salmeterol (ADVAIR HFA) 230-21 MCG/ACT inhaler, Inhale 2 puffs into the lungs 2 (two) times daily. , Disp: , Rfl:  .  Glucosamine-Chondroitin (GLUCOSAMINE CHONDR COMPLEX PO), Take 2 tablets by mouth daily., Disp: , Rfl:  .  HYDROcodone-homatropine (HYCODAN) 5-1.5 MG/5ML syrup, Take 5 mLs by mouth at bedtime as needed for cough., Disp: 120 mL, Rfl: 0 .  montelukast (SINGULAIR) 10 MG tablet, Take 10 mg by mouth at bedtime., Disp: , Rfl:  .  Omega-3 1000 MG CAPS, Take 2 capsules by mouth daily., Disp: , Rfl:  .  Turmeric 500 MG CAPS, Take by mouth daily., Disp: , Rfl:  .  vitamin C (ASCORBIC ACID) 500 MG tablet, Take 500 mg by mouth daily., Disp: , Rfl:  .  azithromycin (ZITHROMAX) 250 MG tablet, 2 tabs by mouth on day 1 follow by 1 tab by mouth daily days 2-5, Disp: 6 tablet, Rfl: 0  No Known Allergies  Objective:  There were no vitals taken for this visit.  VITALS: Per patient if applicable, see vitals. GENERAL: Alert, appears well and in no acute distress. HEENT: Atraumatic, conjunctiva clear, no obvious abnormalities on inspection of external nose and ears. NECK: Normal movements of the head and neck. CARDIOPULMONARY: No increased WOB. Speaking in clear sentences. I:E ratio WNL.  MS: Moves all visible extremities without noticeable abnormality. PSYCH: Pleasant and cooperative, well-groomed. Speech normal rate and rhythm. Affect is appropriate. Insight and judgement are appropriate. Attention is focused, linear, and appropriate.  NEURO: CN grossly intact. Oriented as arrived to appointment on time with no prompting. Moves both UE equally.  SKIN: No obvious lesions, wounds, erythema, or cyanosis noted on face or hands.  Depression screen [facility] 2/9 07/09/2019 06/25/2017 11/25/2015  Decreased Interest 0 0 0  Down, Depressed, Hopeless 0 0 0  PHQ - 2 Score 0 0 0     . COVID-19 Education: The signs and symptoms of COVID-19 were discussed  with the patient and how to seek care for testing if needed. The importance of social distancing was discussed today. . Reviewed expectations re: course of current medical issues. . Discussed self-management of symptoms. . Outlined signs and symptoms indicating need for more acute intervention. . Patient verbalized understanding and all questions were answered. Marland Kitchen Health Maintenance issues including appropriate healthy diet, exercise, and smoking avoidance were discussed with patient. . See orders for this visit as documented in the electronic medical record.  Arnette Norris, MD  Records requested if needed. Time spent: 25 minutes, of which >50% was spent in obtaining information about her symptoms, reviewing her previous labs, evaluations, and treatments, counseling her about her condition (please see the discussed topics above), and developing a plan to further investigate it; she had a number of questions which I addressed.   Lab Results  Component Value Date   WBC 6.2 04/29/2018   HGB 13.1 04/29/2018   HCT 38.4 04/29/2018   PLT 290.0 04/29/2018   GLUCOSE 111 (H) 04/29/2018   CHOL 159 04/29/2018   TRIG 185.0 (H) 04/29/2018   HDL 38.30 (L) 04/29/2018   LDLDIRECT 149.0  12/28/2016   LDLCALC 84 04/29/2018   ALT 20 04/29/2018   AST 15 04/29/2018   NA 140 04/29/2018   K 4.0 04/29/2018   CL 105 04/29/2018   CREATININE 0.77 04/29/2018   BUN 10 04/29/2018   CO2 27 04/29/2018   TSH 3.43 04/29/2018    Lab Results  Component Value Date   TSH 3.43 04/29/2018   Lab Results  Component Value Date   WBC 6.2 04/29/2018   HGB 13.1 04/29/2018   HCT 38.4 04/29/2018   MCV 89.5 04/29/2018   PLT 290.0 04/29/2018   Lab Results  Component Value Date   NA 140 04/29/2018   K 4.0 04/29/2018   CO2 27 04/29/2018   GLUCOSE 111 (H) 04/29/2018   BUN 10 04/29/2018   CREATININE 0.77 04/29/2018   BILITOT 0.6 04/29/2018   ALKPHOS 79 04/29/2018   AST 15 04/29/2018   ALT 20 04/29/2018   PROT 7.3  04/29/2018   ALBUMIN 4.4 04/29/2018   CALCIUM 9.7 04/29/2018   GFR 79.46 04/29/2018   Lab Results  Component Value Date   CHOL 159 04/29/2018   Lab Results  Component Value Date   HDL 38.30 (L) 04/29/2018   Lab Results  Component Value Date   LDLCALC 84 04/29/2018   Lab Results  Component Value Date   TRIG 185.0 (H) 04/29/2018   Lab Results  Component Value Date   CHOLHDL 4 04/29/2018   No results found for: HGBA1C     Assessment & Plan:   Problem List Items Addressed This Visit      Active Problems   Chronic cough    The most common causes of chronic cough include the following: upper airway cough syndrome (UACS) which is caused by variety of rhinosinus conditions; asthma; gastroesophageal reflux disease (GERD); chronic bronchitis from cigarette smoking or other inhaled environmental irritants; non-asthmatic eosinophilic bronchitis; and bronchiectasis. In prospective studies, these conditions have accounted for up to 94% of the causes of chronic cough in immunocompetent adults. The history and physical examination suggest that her cough is multifactorial with contribution from GERD and asthma. We will address these issues at this time.   She is taking both her allergy medication and nexium every night.  Since mucus has changed in color and she felt two rounds of prednisone didn't help, will go ahead and start her on a zpack.  Covid test pending.  Call or send my chart message prn if these symptoms worsen or fail to improve as anticipated. The patient indicates understanding of these issues and agrees with the plan.               I am having Melinda Tran start on azithromycin. I am also having her maintain her cetirizine, calcium carbonate, esomeprazole, cholecalciferol, vitamin C, Turmeric, montelukast, fluticasone-salmeterol, Glucosamine-Chondroitin (GLUCOSAMINE CHONDR COMPLEX PO), Omega-3, albuterol, EpiPen 2-Pak, HYDROcodone-homatropine, and  atorvastatin.  Meds ordered this encounter  Medications  . azithromycin (ZITHROMAX) 250 MG tablet    Sig: 2 tabs by mouth on day 1 follow by 1 tab by mouth daily days 2-5    Dispense:  6 tablet    Refill:  0     Arnette Norris, MD

## 2019-07-23 NOTE — Telephone Encounter (Signed)
Last OV 06/20/19 Last fill 04/29/18  #90/3

## 2019-07-23 NOTE — Assessment & Plan Note (Addendum)
The most common causes of chronic cough include the following: upper airway cough syndrome (UACS) which is caused by variety of rhinosinus conditions; asthma; gastroesophageal reflux disease (GERD); chronic bronchitis from cigarette smoking or other inhaled environmental irritants; non-asthmatic eosinophilic bronchitis; and bronchiectasis. In prospective studies, these conditions have accounted for up to 94% of the causes of chronic cough in immunocompetent adults. The history and physical examination suggest that her cough is multifactorial with contribution from GERD and asthma. We will address these issues at this time.   She is taking both her allergy medication and nexium every night.  Since mucus has changed in color and she felt two rounds of prednisone didn't help, will go ahead and start her on a zpack.  Covid test pending.  Call or send my chart message prn if these symptoms worsen or fail to improve as anticipated. The patient indicates understanding of these issues and agrees with the plan.

## 2019-07-24 ENCOUNTER — Telehealth (INDEPENDENT_AMBULATORY_CARE_PROVIDER_SITE_OTHER): Payer: Medicare Other | Admitting: Family Medicine

## 2019-07-24 ENCOUNTER — Other Ambulatory Visit: Payer: Self-pay

## 2019-07-24 DIAGNOSIS — R05 Cough: Secondary | ICD-10-CM

## 2019-07-24 DIAGNOSIS — R053 Chronic cough: Secondary | ICD-10-CM

## 2019-07-24 MED ORDER — AZITHROMYCIN 250 MG PO TABS: ORAL_TABLET | ORAL | 0 refills | Status: DC

## 2019-07-25 LAB — NOVEL CORONAVIRUS, NAA: SARS-CoV-2, NAA: NOT DETECTED

## 2019-08-04 ENCOUNTER — Encounter: Payer: Self-pay | Admitting: Family Medicine

## 2019-08-06 ENCOUNTER — Other Ambulatory Visit: Payer: Self-pay | Admitting: Family Medicine

## 2019-08-06 MED ORDER — HYDROCODONE-HOMATROPINE 5-1.5 MG/5ML PO SYRP: 5.0000 mL | ORAL_SOLUTION | Freq: Every evening | ORAL | 0 refills | Status: DC | PRN

## 2019-08-14 DIAGNOSIS — J3089 Other allergic rhinitis: Secondary | ICD-10-CM | POA: Diagnosis not present

## 2019-08-14 DIAGNOSIS — J301 Allergic rhinitis due to pollen: Secondary | ICD-10-CM | POA: Diagnosis not present

## 2019-08-15 DIAGNOSIS — J301 Allergic rhinitis due to pollen: Secondary | ICD-10-CM | POA: Diagnosis not present

## 2019-08-15 DIAGNOSIS — J3089 Other allergic rhinitis: Secondary | ICD-10-CM | POA: Diagnosis not present

## 2019-08-19 ENCOUNTER — Ambulatory Visit: Payer: Medicare PPO | Attending: Internal Medicine

## 2019-08-19 DIAGNOSIS — Z20822 Contact with and (suspected) exposure to covid-19: Secondary | ICD-10-CM | POA: Diagnosis not present

## 2019-08-20 LAB — NOVEL CORONAVIRUS, NAA: SARS-CoV-2, NAA: NOT DETECTED

## 2019-08-22 DIAGNOSIS — J301 Allergic rhinitis due to pollen: Secondary | ICD-10-CM | POA: Diagnosis not present

## 2019-08-22 DIAGNOSIS — J3089 Other allergic rhinitis: Secondary | ICD-10-CM | POA: Diagnosis not present

## 2019-08-29 DIAGNOSIS — J301 Allergic rhinitis due to pollen: Secondary | ICD-10-CM | POA: Diagnosis not present

## 2019-08-29 DIAGNOSIS — J3089 Other allergic rhinitis: Secondary | ICD-10-CM | POA: Diagnosis not present

## 2019-09-04 ENCOUNTER — Ambulatory Visit: Payer: Medicare PPO

## 2019-09-05 DIAGNOSIS — J301 Allergic rhinitis due to pollen: Secondary | ICD-10-CM | POA: Diagnosis not present

## 2019-09-05 DIAGNOSIS — J3089 Other allergic rhinitis: Secondary | ICD-10-CM | POA: Diagnosis not present

## 2019-09-08 DIAGNOSIS — J3089 Other allergic rhinitis: Secondary | ICD-10-CM | POA: Diagnosis not present

## 2019-09-08 DIAGNOSIS — J301 Allergic rhinitis due to pollen: Secondary | ICD-10-CM | POA: Diagnosis not present

## 2019-09-12 ENCOUNTER — Ambulatory Visit: Payer: Medicare PPO | Attending: Internal Medicine

## 2019-09-12 DIAGNOSIS — Z23 Encounter for immunization: Secondary | ICD-10-CM

## 2019-09-12 NOTE — Progress Notes (Signed)
   Covid-19 Vaccination Clinic  Name:  Melinda Tran    MRN: [id number] DOB: 1951-01-18  09/12/2019  [name] was observed post Covid-19 immunization for 15 minutes without incidence. She was provided with Vaccine Information Sheet and instruction to access the V-Safe system.   [name] was instructed to call 911 with any severe reactions post vaccine: Marland Kitchen Difficulty breathing  . Swelling of your face and throat  . A fast heartbeat  . A bad rash all over your body  . Dizziness and weakness    Immunizations Administered    Name Date Dose VIS Date Route   Pfizer COVID-19 Vaccine 09/12/2019  3:18 PM 0.3 mL 07/18/2019 Intramuscular   Manufacturer: [place]   Lot: [id number]   [place]: [id number]

## 2019-09-18 DIAGNOSIS — J301 Allergic rhinitis due to pollen: Secondary | ICD-10-CM | POA: Diagnosis not present

## 2019-09-18 DIAGNOSIS — J3089 Other allergic rhinitis: Secondary | ICD-10-CM | POA: Diagnosis not present

## 2019-09-21 ENCOUNTER — Ambulatory Visit: Payer: Medicare PPO

## 2019-09-26 DIAGNOSIS — J301 Allergic rhinitis due to pollen: Secondary | ICD-10-CM | POA: Diagnosis not present

## 2019-09-26 DIAGNOSIS — J3089 Other allergic rhinitis: Secondary | ICD-10-CM | POA: Diagnosis not present

## 2019-10-03 DIAGNOSIS — J3089 Other allergic rhinitis: Secondary | ICD-10-CM | POA: Diagnosis not present

## 2019-10-03 DIAGNOSIS — J301 Allergic rhinitis due to pollen: Secondary | ICD-10-CM | POA: Diagnosis not present

## 2019-10-07 ENCOUNTER — Ambulatory Visit: Payer: Medicare PPO | Attending: Internal Medicine

## 2019-10-07 DIAGNOSIS — Z23 Encounter for immunization: Secondary | ICD-10-CM | POA: Insufficient documentation

## 2019-10-07 NOTE — Progress Notes (Signed)
   Covid-19 Vaccination Clinic  Name:  Melinda Tran    MRN: [id number] DOB: 04-Jun-1951  10/07/2019  [name] was observed post Covid-19 immunization for 15 minutes without incident. She was provided with Vaccine Information Sheet and instruction to access the V-Safe system.   [name] was instructed to call 911 with any severe reactions post vaccine: Marland Kitchen Difficulty breathing  . Swelling of face and throat  . A fast heartbeat  . A bad rash all over body  . Dizziness and weakness   Immunizations Administered    Name Date Dose VIS Date Route   Pfizer COVID-19 Vaccine 10/07/2019  2:56 PM 0.3 mL 07/18/2019 Intramuscular   Manufacturer: [place]   Lot: [id number]   [place]: [id number]

## 2019-10-17 DIAGNOSIS — J3089 Other allergic rhinitis: Secondary | ICD-10-CM | POA: Diagnosis not present

## 2019-10-17 DIAGNOSIS — J301 Allergic rhinitis due to pollen: Secondary | ICD-10-CM | POA: Diagnosis not present

## 2019-10-24 DIAGNOSIS — J3089 Other allergic rhinitis: Secondary | ICD-10-CM | POA: Diagnosis not present

## 2019-10-24 DIAGNOSIS — J301 Allergic rhinitis due to pollen: Secondary | ICD-10-CM | POA: Diagnosis not present

## 2019-10-31 DIAGNOSIS — J301 Allergic rhinitis due to pollen: Secondary | ICD-10-CM | POA: Diagnosis not present

## 2019-10-31 DIAGNOSIS — J3089 Other allergic rhinitis: Secondary | ICD-10-CM | POA: Diagnosis not present

## 2019-11-06 DIAGNOSIS — J3089 Other allergic rhinitis: Secondary | ICD-10-CM | POA: Diagnosis not present

## 2019-11-06 DIAGNOSIS — J301 Allergic rhinitis due to pollen: Secondary | ICD-10-CM | POA: Diagnosis not present

## 2019-11-14 DIAGNOSIS — J301 Allergic rhinitis due to pollen: Secondary | ICD-10-CM | POA: Diagnosis not present

## 2019-11-14 DIAGNOSIS — J3089 Other allergic rhinitis: Secondary | ICD-10-CM | POA: Diagnosis not present

## 2019-11-19 ENCOUNTER — Encounter: Payer: Self-pay | Admitting: Family Medicine

## 2019-11-19 ENCOUNTER — Other Ambulatory Visit: Payer: Self-pay

## 2019-11-19 ENCOUNTER — Ambulatory Visit: Payer: Medicare PPO | Admitting: Family Medicine

## 2019-11-19 VITALS — BP 132/74 | HR 64 | Temp 97.7°F | Resp 20 | Ht 64.75 in | Wt 153.5 lb

## 2019-11-19 DIAGNOSIS — R739 Hyperglycemia, unspecified: Secondary | ICD-10-CM | POA: Insufficient documentation

## 2019-11-19 DIAGNOSIS — Z23 Encounter for immunization: Secondary | ICD-10-CM

## 2019-11-19 DIAGNOSIS — K635 Polyp of colon: Secondary | ICD-10-CM | POA: Insufficient documentation

## 2019-11-19 DIAGNOSIS — J4530 Mild persistent asthma, uncomplicated: Secondary | ICD-10-CM

## 2019-11-19 DIAGNOSIS — E785 Hyperlipidemia, unspecified: Secondary | ICD-10-CM

## 2019-11-19 LAB — COMPREHENSIVE METABOLIC PANEL
ALT: 18 U/L (ref 0–35)
AST: 13 U/L (ref 0–37)
Albumin: 4.5 g/dL (ref 3.5–5.2)
Alkaline Phosphatase: 76 U/L (ref 39–117)
BUN: 13 mg/dL (ref 6–23)
CO2: 30 mEq/L (ref 19–32)
Calcium: 9.6 mg/dL (ref 8.4–10.5)
Chloride: 104 mEq/L (ref 96–112)
Creatinine, Ser: 0.77 mg/dL (ref 0.40–1.20)
GFR: 74.42 mL/min (ref >60.00)
Glucose, Bld: 84 mg/dL (ref 70–99)
Potassium: 3.9 mEq/L (ref 3.5–5.1)
Sodium: 141 mEq/L (ref 135–145)
Total Bilirubin: 0.7 mg/dL (ref 0.2–1.2)
Total Protein: 6.7 g/dL (ref 6.0–8.3)

## 2019-11-19 LAB — LIPID PANEL
Cholesterol: 189 mg/dL (ref 0–200)
HDL: 44.7 mg/dL (ref >39.00)
LDL Cholesterol: 111 mg/dL — ABNORMAL HIGH (ref 0–99)
NonHDL: 144.63
Total CHOL/HDL Ratio: 4
Triglycerides: 167 mg/dL — ABNORMAL HIGH (ref 0.0–149.0)
VLDL: 33.4 mg/dL (ref 0.0–40.0)

## 2019-11-19 LAB — HEMOGLOBIN A1C: Hgb A1c MFr Bld: 5.1 % (ref 4.6–6.5)

## 2019-11-19 MED ORDER — ATORVASTATIN CALCIUM 20 MG PO TABS: 20.0000 mg | ORAL_TABLET | Freq: Every day | ORAL | 3 refills | Status: DC

## 2019-11-19 NOTE — Progress Notes (Signed)
   Subjective:     Melinda Tran is a 69 y.o. female presenting for transfer of care (from Dr Deborra Medina)     HPI  #Asthma - still coughing with allergies - overall doing OK  - sees a specialist next month  Taking cholesterol medication w/o issue  Review of Systems   Social History   Tobacco Use  Smoking Status Former Smoker  . Packs/day: 0.25  . Years: 5.00  . Pack years: 1.25  . Types: Cigarettes  . Quit date: 30  . Years since quitting: 44.3  Smokeless Tobacco Never Used        Objective:    BP Readings from Last 3 Encounters:  11/19/19 132/74  10/14/18 130/76  04/29/18 128/80   Wt Readings from Last 3 Encounters:  11/19/19 153 lb 8 oz (69.6 kg)  10/14/18 186 lb 6.4 oz (84.6 kg)  04/29/18 200 lb (90.7 kg)    BP 132/74   Pulse 64   Temp 97.7 F (36.5 C)   Resp 20   Ht 5' 4.75" (1.645 m)   Wt 153 lb 8 oz (69.6 kg)   SpO2 97%   BMI 25.74 kg/m    Physical Exam Constitutional:      General: She is not in acute distress.    Appearance: She is well-developed. She is not diaphoretic.  HENT:     Right Ear: External ear normal.     Left Ear: External ear normal.  Eyes:     Conjunctiva/sclera: Conjunctivae normal.  Cardiovascular:     Rate and Rhythm: Normal rate and regular rhythm.  Pulmonary:     Effort: Pulmonary effort is normal. No respiratory distress.     Breath sounds: Normal breath sounds. No wheezing.  Musculoskeletal:     Cervical back: Neck supple.  Skin:    General: Skin is warm and dry.     Capillary Refill: Capillary refill takes less than 2 seconds.  Neurological:     Mental Status: She is alert. Mental status is at baseline.  Psychiatric:        Mood and Affect: Mood normal.        Behavior: Behavior normal.           Assessment & Plan:   Problem List Items Addressed This Visit      Respiratory   Allergic asthma - Primary    Stable. Follows with specialist. Continue current treatment.       Relevant Medications    PROAIR RESPICLICK 123XX123 (90 Base) MCG/ACT AEPB     Digestive   Colon polyps    She was due for repeat colonoscopy 09/2019. Advised calling GI, she will do.         Other   HLD (hyperlipidemia)    Taking atorvastatin w/o issues. Last lipids LDL 84 will repeat. Cont medication      Relevant Medications   atorvastatin (LIPITOR) 20 MG tablet   Other Relevant Orders   Comprehensive metabolic panel   Lipid panel   Blood glucose elevated    Will get HgbA1c as prior glucose elevated.       Relevant Orders   Hemoglobin A1c       Return in about 1 year (around 11/18/2020).  Lesleigh Noe, MD

## 2019-11-19 NOTE — Assessment & Plan Note (Signed)
Stable. Follows with specialist. Continue current treatment.

## 2019-11-19 NOTE — Patient Instructions (Addendum)
You can call for a mammogram at these locations:  [place] [facility] [place]   Call [facility]

## 2019-11-19 NOTE — Assessment & Plan Note (Signed)
Will get HgbA1c as prior glucose elevated.

## 2019-11-19 NOTE — Assessment & Plan Note (Signed)
She was due for repeat colonoscopy 09/2019. Advised calling GI, she will do.

## 2019-11-19 NOTE — Assessment & Plan Note (Signed)
Taking atorvastatin w/o issues. Last lipids LDL 84 will repeat. Cont medication

## 2020-01-08 DIAGNOSIS — J3089 Other allergic rhinitis: Secondary | ICD-10-CM | POA: Diagnosis not present

## 2020-01-08 DIAGNOSIS — J301 Allergic rhinitis due to pollen: Secondary | ICD-10-CM | POA: Diagnosis not present

## 2020-01-15 DIAGNOSIS — J301 Allergic rhinitis due to pollen: Secondary | ICD-10-CM | POA: Diagnosis not present

## 2020-01-15 DIAGNOSIS — J3089 Other allergic rhinitis: Secondary | ICD-10-CM | POA: Diagnosis not present

## 2020-01-16 DIAGNOSIS — J3089 Other allergic rhinitis: Secondary | ICD-10-CM | POA: Diagnosis not present

## 2020-01-16 DIAGNOSIS — J301 Allergic rhinitis due to pollen: Secondary | ICD-10-CM | POA: Diagnosis not present

## 2020-01-19 DIAGNOSIS — J301 Allergic rhinitis due to pollen: Secondary | ICD-10-CM | POA: Diagnosis not present

## 2020-01-19 DIAGNOSIS — J209 Acute bronchitis, unspecified: Secondary | ICD-10-CM | POA: Diagnosis not present

## 2020-01-19 DIAGNOSIS — J3089 Other allergic rhinitis: Secondary | ICD-10-CM | POA: Diagnosis not present

## 2020-01-19 DIAGNOSIS — H1045 Other chronic allergic conjunctivitis: Secondary | ICD-10-CM | POA: Diagnosis not present

## 2020-01-19 DIAGNOSIS — K219 Gastro-esophageal reflux disease without esophagitis: Secondary | ICD-10-CM | POA: Diagnosis not present

## 2020-01-30 DIAGNOSIS — J3089 Other allergic rhinitis: Secondary | ICD-10-CM | POA: Diagnosis not present

## 2020-01-30 DIAGNOSIS — J301 Allergic rhinitis due to pollen: Secondary | ICD-10-CM | POA: Diagnosis not present

## 2020-02-06 DIAGNOSIS — J3089 Other allergic rhinitis: Secondary | ICD-10-CM | POA: Diagnosis not present

## 2020-02-06 DIAGNOSIS — J301 Allergic rhinitis due to pollen: Secondary | ICD-10-CM | POA: Diagnosis not present

## 2020-02-13 DIAGNOSIS — J3089 Other allergic rhinitis: Secondary | ICD-10-CM | POA: Diagnosis not present

## 2020-02-13 DIAGNOSIS — J301 Allergic rhinitis due to pollen: Secondary | ICD-10-CM | POA: Diagnosis not present

## 2020-02-20 DIAGNOSIS — J3089 Other allergic rhinitis: Secondary | ICD-10-CM | POA: Diagnosis not present

## 2020-02-20 DIAGNOSIS — J301 Allergic rhinitis due to pollen: Secondary | ICD-10-CM | POA: Diagnosis not present

## 2020-02-27 DIAGNOSIS — J3089 Other allergic rhinitis: Secondary | ICD-10-CM | POA: Diagnosis not present

## 2020-02-27 DIAGNOSIS — J301 Allergic rhinitis due to pollen: Secondary | ICD-10-CM | POA: Diagnosis not present

## 2020-03-05 DIAGNOSIS — J3089 Other allergic rhinitis: Secondary | ICD-10-CM | POA: Diagnosis not present

## 2020-03-05 DIAGNOSIS — J301 Allergic rhinitis due to pollen: Secondary | ICD-10-CM | POA: Diagnosis not present

## 2020-03-12 DIAGNOSIS — J301 Allergic rhinitis due to pollen: Secondary | ICD-10-CM | POA: Diagnosis not present

## 2020-03-12 DIAGNOSIS — J3089 Other allergic rhinitis: Secondary | ICD-10-CM | POA: Diagnosis not present

## 2020-03-18 DIAGNOSIS — J3089 Other allergic rhinitis: Secondary | ICD-10-CM | POA: Diagnosis not present

## 2020-03-18 DIAGNOSIS — J301 Allergic rhinitis due to pollen: Secondary | ICD-10-CM | POA: Diagnosis not present

## 2020-03-26 DIAGNOSIS — J301 Allergic rhinitis due to pollen: Secondary | ICD-10-CM | POA: Diagnosis not present

## 2020-03-26 DIAGNOSIS — J3089 Other allergic rhinitis: Secondary | ICD-10-CM | POA: Diagnosis not present

## 2020-04-02 DIAGNOSIS — J301 Allergic rhinitis due to pollen: Secondary | ICD-10-CM | POA: Diagnosis not present

## 2020-04-02 DIAGNOSIS — J3089 Other allergic rhinitis: Secondary | ICD-10-CM | POA: Diagnosis not present

## 2020-04-09 DIAGNOSIS — J301 Allergic rhinitis due to pollen: Secondary | ICD-10-CM | POA: Diagnosis not present

## 2020-04-09 DIAGNOSIS — J3089 Other allergic rhinitis: Secondary | ICD-10-CM | POA: Diagnosis not present

## 2020-04-14 ENCOUNTER — Encounter: Payer: Self-pay | Admitting: Family Medicine

## 2020-04-14 ENCOUNTER — Telehealth (INDEPENDENT_AMBULATORY_CARE_PROVIDER_SITE_OTHER): Payer: Medicare PPO | Admitting: Family Medicine

## 2020-04-14 ENCOUNTER — Other Ambulatory Visit: Payer: Self-pay

## 2020-04-14 VITALS — Temp 97.6°F | Ht 64.75 in | Wt 154.0 lb

## 2020-04-14 DIAGNOSIS — J205 Acute bronchitis due to respiratory syncytial virus: Secondary | ICD-10-CM | POA: Diagnosis not present

## 2020-04-14 DIAGNOSIS — J4541 Moderate persistent asthma with (acute) exacerbation: Secondary | ICD-10-CM | POA: Diagnosis not present

## 2020-04-14 MED ORDER — PREDNISONE 20 MG PO TABS: ORAL_TABLET | ORAL | 0 refills | Status: DC

## 2020-04-14 MED ORDER — AZITHROMYCIN 250 MG PO TABS: ORAL_TABLET | ORAL | 0 refills | Status: DC

## 2020-04-14 MED ORDER — PROAIR RESPICLICK 108 (90 BASE) MCG/ACT IN AEPB: 2.0000 | INHALATION_SPRAY | Freq: Four times a day (QID) | RESPIRATORY_TRACT | 6 refills | Status: DC | PRN

## 2020-04-14 MED ORDER — HYDROCODONE-HOMATROPINE 5-1.5 MG/5ML PO SYRP: 5.0000 mL | ORAL_SOLUTION | Freq: Two times a day (BID) | ORAL | 0 refills | Status: DC | PRN

## 2020-04-14 MED ORDER — HYDROCODONE-HOMATROPINE 5-1.5 MG/5ML PO SYRP: 5.0000 mL | ORAL_SOLUTION | Freq: Three times a day (TID) | ORAL | 0 refills | Status: DC | PRN

## 2020-04-14 MED ORDER — CHERATUSSIN AC 100-10 MG/5ML PO SOLN: 5.0000 mL | Freq: Two times a day (BID) | ORAL | 0 refills | Status: DC | PRN

## 2020-04-14 NOTE — Assessment & Plan Note (Signed)
Story /exam most consistent with RSV bronchitis (with recent exposure). Reviewed supportive care measures as well as red flags to seek in person evaluation. Rx prednisone taper, zpack, hycodan cough syrup (has tolerated well in the past) and refilled albuterol inhaler. She will let us know if not improving with treatment.

## 2020-04-14 NOTE — Assessment & Plan Note (Addendum)
Increased cough, sputum production, dyspnea and wheeze. No cyanosis on video evaluation.  Rx zpack, prednisone taper, cough syrup and schedule albuterol.  Red flags to seek in person evaluation reviewed - she will let us know if not improving with treatment.

## 2020-04-14 NOTE — Progress Notes (Signed)
Virtual visit completed through MyChart, a video enabled telemedicine application. Due to national recommendations of social distancing due to COVID-19, a virtual visit is felt to be most appropriate for this patient at this time. Reviewed limitations, risks, security and privacy concerns of performing a virtual visit and the availability of in person appointments. I also reviewed that there may be a patient responsible charge related to this service. The patient agreed to proceed.   Patient location: home Provider location: [facility], office Persons participating in this virtual visit: patient, provider  If any vitals were documented, they were collected by patient at home unless specified below.    Temp 97.6 F (36.4 C)   Ht 5' 4.75" (1.645 m)   Wt 154 lb (69.9 kg)   BMI 25.83 kg/m    CC: cough Subjective:    Patient ID: Melinda Tran, female    DOB: 06/20/1951, 69 y.o.   MRN: [id number]  HPI: Melinda Tran is a 69 y.o. female presenting on 04/14/2020 for Asthma (C/o cough, worse than normal asthma cough.  Has wheezing/rattling in chest. Thinks due to change in season.  Started about 2 wks ago but kept granddaughter last week who has RSV, now pt is worse. )   Known mod persistent asthma managed with advair 228mcg 2 puffs bid, singulair, and PRN proair inhaler. Season changes can trigger asthma flare as well as dust, dust mites, tress and grasses. She has been working outdoors more. 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  Recently kept grand daughter with RSV.  No known COVID exposure.   2 wks ago started having worsening cough. Increased white sputum production. Towards end of last week she had progressive cough affecting sleep. Increased wheezing, chest rattles at night, increased dyspnea. Increased mucous production.   No fevers/chills, loss of taste/smell, ST, HA, abd pain, nausea/vomiting, diarrhea. no noted cyanosis.  She's been using proair  albuterol inhaler 2 puffs BID to TID.   Has done well with hydrocodone-homatropine cough syrup.   Completed [place] vaccine 09/2019, 10/2019.       Relevant past medical, surgical, family and social history reviewed and updated as indicated. Interim medical history since our last visit reviewed. Allergies and medications reviewed and updated. Outpatient Medications Prior to Visit  Medication Sig Dispense Refill  . atorvastatin (LIPITOR) 20 MG tablet Take 1 tablet (20 mg total) by mouth daily. 90 tablet 3  . calcium carbonate 200 MG capsule Take 250 mg by mouth daily.    . cetirizine (ZYRTEC) 10 MG tablet Take 10 mg by mouth daily.    . cholecalciferol (VITAMIN D) 1000 UNITS tablet Take 1,000 Units by mouth daily.    Marland Kitchen EPIPEN 2-PAK 0.3 MG/0.3ML SOAJ injection See admin instructions.    Marland Kitchen esomeprazole (NEXIUM) 40 MG capsule Take 40 mg by mouth daily at 12 noon.    . fluticasone-salmeterol (ADVAIR HFA) 230-21 MCG/ACT inhaler Inhale 2 puffs into the lungs 2 (two) times daily.     . Glucosamine-Chondroitin (GLUCOSAMINE CHONDR COMPLEX PO) Take 2 tablets by mouth daily.    . montelukast (SINGULAIR) 10 MG tablet Take 10 mg by mouth at bedtime.    . Omega-3 1000 MG CAPS Take 2 capsules by mouth daily.    . Turmeric 500 MG CAPS Take by mouth daily.    . vitamin C (ASCORBIC ACID) 500 MG tablet Take 500 mg by mouth daily.    Marland Kitchen PROAIR RESPICLICK 741 (90 Base) MCG/ACT  AEPB 1 to 2 inhalations every 4 to 6 hours as needed     No facility-administered medications prior to visit.     Per HPI unless specifically indicated in ROS section below Review of Systems Objective:  Temp 97.6 F (36.4 C)   Ht 5' 4.75" (1.645 m)   Wt 154 lb (69.9 kg)   BMI 25.83 kg/m   Wt Readings from Last 3 Encounters:  04/14/20 154 lb (69.9 kg)  11/19/19 153 lb 8 oz (69.6 kg)  10/14/18 186 lb 6.4 oz (84.6 kg)       Physical exam: Gen: alert, NAD, tired appearing, sounds congested Pulm: speaks in complete  sentences without increased work of breathing, cough present  Psych: normal mood, normal thought content      Assessment & Plan:   Problem List Items Addressed This Visit    RSV bronchitis - Primary    Story /exam most consistent with RSV bronchitis (with recent exposure). Reviewed supportive care measures as well as red flags to seek in person evaluation. Rx prednisone taper, zpack, hycodan cough syrup (has tolerated well in the past) and refilled albuterol inhaler. She will let us know if not improving with treatment.       Relevant Medications   azithromycin (ZITHROMAX) 250 MG tablet   Asthma exacerbation    Increased cough, sputum production, dyspnea and wheeze. No cyanosis on video evaluation.  Rx zpack, prednisone taper, cough syrup and schedule albuterol.  Red flags to seek in person evaluation reviewed - she will let us know if not improving with treatment.       Relevant Medications   PROAIR RESPICLICK 921 (90 Base) MCG/ACT AEPB   predniSONE (DELTASONE) 20 MG tablet       Meds ordered this encounter  Medications  . DISCONTD: HYDROcodone-homatropine (HYCODAN) 5-1.5 MG/5ML syrup    Sig: Take 5 mLs by mouth 3 (three) times daily as needed for cough (sedation precautions).    Dispense:  120 mL    Refill:  0  . PROAIR RESPICLICK 194 (90 Base) MCG/ACT AEPB    Sig: Inhale 2 puffs into the lungs 4 (four) times daily as needed (dyspnea, cough, wheeze).    Dispense:  1 each    Refill:  6  . predniSONE (DELTASONE) 20 MG tablet    Sig: Take two tablets daily for 3 days followed by one tablet daily for 4 days    Dispense:  10 tablet    Refill:  0  . azithromycin (ZITHROMAX) 250 MG tablet    Sig: Take two tablets on day one followed by one tablet on days 2-5    Dispense:  6 each    Refill:  0  . HYDROcodone-homatropine (HYCODAN) 5-1.5 MG/5ML syrup    Sig: Take 5 mLs by mouth 2 (two) times daily as needed for cough (sedation precautions).    Dispense:  120 mL    Refill:  0     Use this sig   No orders of the defined types were placed in this encounter.   I discussed the assessment and treatment plan with the patient. The patient was provided an opportunity to ask questions and all were answered. The patient agreed with the plan and demonstrated an understanding of the instructions. The patient was advised to call back or seek an in-person evaluation if the symptoms worsen or if the condition fails to improve as anticipated.  Follow up plan: No follow-ups on file.  Ria Bush, MD

## 2020-04-16 DIAGNOSIS — J3089 Other allergic rhinitis: Secondary | ICD-10-CM | POA: Diagnosis not present

## 2020-04-16 DIAGNOSIS — J301 Allergic rhinitis due to pollen: Secondary | ICD-10-CM | POA: Diagnosis not present

## 2020-04-23 DIAGNOSIS — J3089 Other allergic rhinitis: Secondary | ICD-10-CM | POA: Diagnosis not present

## 2020-04-23 DIAGNOSIS — J301 Allergic rhinitis due to pollen: Secondary | ICD-10-CM | POA: Diagnosis not present

## 2020-04-30 DIAGNOSIS — J3081 Allergic rhinitis due to animal (cat) (dog) hair and dander: Secondary | ICD-10-CM | POA: Diagnosis not present

## 2020-04-30 DIAGNOSIS — J301 Allergic rhinitis due to pollen: Secondary | ICD-10-CM | POA: Diagnosis not present

## 2020-04-30 DIAGNOSIS — J3089 Other allergic rhinitis: Secondary | ICD-10-CM | POA: Diagnosis not present

## 2020-05-07 DIAGNOSIS — J3089 Other allergic rhinitis: Secondary | ICD-10-CM | POA: Diagnosis not present

## 2020-05-07 DIAGNOSIS — J301 Allergic rhinitis due to pollen: Secondary | ICD-10-CM | POA: Diagnosis not present

## 2020-05-14 DIAGNOSIS — J3089 Other allergic rhinitis: Secondary | ICD-10-CM | POA: Diagnosis not present

## 2020-05-14 DIAGNOSIS — J301 Allergic rhinitis due to pollen: Secondary | ICD-10-CM | POA: Diagnosis not present

## 2020-05-14 DIAGNOSIS — J3081 Allergic rhinitis due to animal (cat) (dog) hair and dander: Secondary | ICD-10-CM | POA: Diagnosis not present

## 2020-05-21 DIAGNOSIS — J3089 Other allergic rhinitis: Secondary | ICD-10-CM | POA: Diagnosis not present

## 2020-05-21 DIAGNOSIS — J301 Allergic rhinitis due to pollen: Secondary | ICD-10-CM | POA: Diagnosis not present

## 2020-05-27 DIAGNOSIS — J3089 Other allergic rhinitis: Secondary | ICD-10-CM | POA: Diagnosis not present

## 2020-05-27 DIAGNOSIS — J301 Allergic rhinitis due to pollen: Secondary | ICD-10-CM | POA: Diagnosis not present

## 2020-06-11 DIAGNOSIS — J3089 Other allergic rhinitis: Secondary | ICD-10-CM | POA: Diagnosis not present

## 2020-06-11 DIAGNOSIS — J3081 Allergic rhinitis due to animal (cat) (dog) hair and dander: Secondary | ICD-10-CM | POA: Diagnosis not present

## 2020-06-11 DIAGNOSIS — J301 Allergic rhinitis due to pollen: Secondary | ICD-10-CM | POA: Diagnosis not present

## 2020-06-18 DIAGNOSIS — J3089 Other allergic rhinitis: Secondary | ICD-10-CM | POA: Diagnosis not present

## 2020-06-18 DIAGNOSIS — J301 Allergic rhinitis due to pollen: Secondary | ICD-10-CM | POA: Diagnosis not present

## 2020-06-24 DIAGNOSIS — J301 Allergic rhinitis due to pollen: Secondary | ICD-10-CM | POA: Diagnosis not present

## 2020-06-24 DIAGNOSIS — J3089 Other allergic rhinitis: Secondary | ICD-10-CM | POA: Diagnosis not present

## 2020-06-29 DIAGNOSIS — J3089 Other allergic rhinitis: Secondary | ICD-10-CM | POA: Diagnosis not present

## 2020-06-29 DIAGNOSIS — J301 Allergic rhinitis due to pollen: Secondary | ICD-10-CM | POA: Diagnosis not present

## 2020-07-16 DIAGNOSIS — J301 Allergic rhinitis due to pollen: Secondary | ICD-10-CM | POA: Diagnosis not present

## 2020-07-16 DIAGNOSIS — J3089 Other allergic rhinitis: Secondary | ICD-10-CM | POA: Diagnosis not present

## 2020-07-23 DIAGNOSIS — J301 Allergic rhinitis due to pollen: Secondary | ICD-10-CM | POA: Diagnosis not present

## 2020-07-23 DIAGNOSIS — J3089 Other allergic rhinitis: Secondary | ICD-10-CM | POA: Diagnosis not present

## 2020-07-27 ENCOUNTER — Other Ambulatory Visit: Payer: Self-pay

## 2020-07-27 ENCOUNTER — Encounter: Payer: Self-pay | Admitting: Family Medicine

## 2020-07-27 ENCOUNTER — Telehealth (INDEPENDENT_AMBULATORY_CARE_PROVIDER_SITE_OTHER): Payer: Medicare PPO | Admitting: Family Medicine

## 2020-07-27 VITALS — Ht 64.75 in | Wt 155.0 lb

## 2020-07-27 DIAGNOSIS — R059 Cough, unspecified: Secondary | ICD-10-CM | POA: Diagnosis not present

## 2020-07-27 DIAGNOSIS — J4541 Moderate persistent asthma with (acute) exacerbation: Secondary | ICD-10-CM

## 2020-07-27 MED ORDER — AZITHROMYCIN 250 MG PO TABS: ORAL_TABLET | ORAL | 0 refills | Status: AC

## 2020-07-27 MED ORDER — PREDNISONE 20 MG PO TABS: ORAL_TABLET | ORAL | 0 refills | Status: DC

## 2020-07-27 MED ORDER — CHERATUSSIN AC 100-10 MG/5ML PO SOLN
5.0000 mL | Freq: Two times a day (BID) | ORAL | 0 refills | Status: DC | PRN
Start: 2020-07-27 — End: 2020-12-14

## 2020-07-27 NOTE — Progress Notes (Signed)
Melinda Bove T. Ramzy Cappelletti, MD Primary Care and Sports Medicine [facility] at [facility] [place], [address] Phone: [phone]   FAX: [phone]  Melinda Tran - 69 y.o. female   MRN [id number]   Date of Birth: 1951-02-25  Visit Date: 07/27/2020   PCP: Lesleigh Noe, MD   Referred by: Lesleigh Noe, MD Chief Complaint  Patient presents with   Cough    with green/yellow mucus   Wheezing    Keep patient up at night   Virtual Visit via Video Note:  I connected with  Melinda Tran on 07/27/2020  9:40 AM EST by a video enabled telemedicine application and verified that I am speaking with the correct person using two identifiers.   Location patient: home computer, tablet, or smartphone Location provider: work or home office Consent: Verbal consent directly obtained from [employer]. Persons participating in the virtual visit: patient, provider  I discussed the limitations of evaluation and management by telemedicine and the availability of in person appointments. The patient expressed understanding and agreed to proceed.  Interactive audio and video telecommunications were attempted between this provider and patient, however failed, due to patient having technical difficulties OR patient did not have access to video capability.  We continued and completed visit with audio only.    History of Present Illness:  Has some asthma and will sometimes have an asthma cough.  Over the weekend, will do her neti pot, and others will get green mucous.  She is having some wheezing in her chest, and she describes this as rattling.  She has been a longstanding asthma patient, and she feels like she is having an asthma exacerbation.  She does have some sinus congestion and pain.  She denies any other symptoms including GI symptoms.  No neurological symptoms.  She does not think that she had any close contacts with anyone sick, and no known contacts of  COVID-19.  Immunization History  Administered Date(s) Administered   Fluad Quad(high Dose 65+) 04/29/2019   Influenza Split 06/02/2011   Influenza Whole 06/14/2007, 05/26/2013   Influenza, High Dose Seasonal PF 07/03/2016, 07/02/2017, 07/02/2018, 06/30/2019   Influenza,inj,Quad PF,6+ Mos 04/16/2014, 05/27/2015, 05/23/2016, 05/17/2017, 04/29/2018   PFIZER SARS-COV-2 Vaccination 09/12/2019, 10/07/2019, 01/19/2020   Pneumococcal Conjugate-13 06/25/2017   Pneumococcal Polysaccharide-23 08/07/2006, 07/03/2016, 11/29/2016, 07/02/2017, 07/02/2018, 06/30/2019, 11/19/2019, 01/19/2020   Zoster 04/16/2014     Review of Systems as above: See pertinent positives and pertinent negatives per HPI No acute distress verbally   Observations/Objective/Exam:  An attempt was made to discern vital signs over the phone and per patient if applicable and possible.   General:    Alert, Oriented, appears well and in no acute distress  Pulmonary:     On inspection no signs of respiratory distress.  Psych / Neurological:     Pleasant and cooperative.  Assessment and Plan:    ICD-10-CM   1. Moderate persistent asthma with exacerbation  J45.41   2. Cough  R05.9    Total encounter time: 20 minutes. This includes total time spent on the day of encounter.  This includes chart review on an asthmatic patient age 9. Asthma exertion with persistent cough.  Steroids, Zithromax.  Cheratussin as needed for cough.  Also encouraged her to get a [id number] test.  Certainly this could be primary driver of her asthma exacerbation.  I discussed the assessment and treatment plan with the patient. The patient was provided an  opportunity to ask questions and all were answered. The patient agreed with the plan and demonstrated an understanding of the instructions.   The patient was advised to call back or seek an in-person evaluation if the symptoms worsen or if the condition fails to improve as  anticipated.  Follow-up: prn unless noted otherwise below No follow-ups on file.  Meds ordered this encounter  Medications   predniSONE (DELTASONE) 20 MG tablet    Sig: 2 tabs for 4 days, then 1 tab for 4 days    Dispense:  12 tablet    Refill:  0   azithromycin (ZITHROMAX) 250 MG tablet    Sig: Take 2 tablets (500 mg total) by mouth daily for 1 day, THEN 1 tablet (250 mg total) daily for 4 days.    Dispense:  6 tablet    Refill:  0   guaiFENesin-codeine (CHERATUSSIN AC) 100-10 MG/5ML syrup    Sig: Take 5 mLs by mouth 2 (two) times daily as needed for cough (sedation precautions).    Dispense:  120 mL    Refill:  0   No orders of the defined types were placed in this encounter.   Signed,  Maud Deed. Griffey Nicasio, MD

## 2020-07-29 DIAGNOSIS — J301 Allergic rhinitis due to pollen: Secondary | ICD-10-CM | POA: Diagnosis not present

## 2020-07-29 DIAGNOSIS — J3089 Other allergic rhinitis: Secondary | ICD-10-CM | POA: Diagnosis not present

## 2020-08-03 DIAGNOSIS — J3089 Other allergic rhinitis: Secondary | ICD-10-CM | POA: Diagnosis not present

## 2020-08-03 DIAGNOSIS — J301 Allergic rhinitis due to pollen: Secondary | ICD-10-CM | POA: Diagnosis not present

## 2020-08-04 ENCOUNTER — Encounter: Payer: Self-pay | Admitting: Family Medicine

## 2020-08-05 MED ORDER — PREDNISONE 20 MG PO TABS: ORAL_TABLET | ORAL | 0 refills | Status: DC

## 2020-08-13 DIAGNOSIS — J301 Allergic rhinitis due to pollen: Secondary | ICD-10-CM | POA: Diagnosis not present

## 2020-08-13 DIAGNOSIS — J3089 Other allergic rhinitis: Secondary | ICD-10-CM | POA: Diagnosis not present

## 2020-08-20 DIAGNOSIS — J3089 Other allergic rhinitis: Secondary | ICD-10-CM | POA: Diagnosis not present

## 2020-08-20 DIAGNOSIS — J301 Allergic rhinitis due to pollen: Secondary | ICD-10-CM | POA: Diagnosis not present

## 2020-08-27 DIAGNOSIS — J3089 Other allergic rhinitis: Secondary | ICD-10-CM | POA: Diagnosis not present

## 2020-08-27 DIAGNOSIS — J301 Allergic rhinitis due to pollen: Secondary | ICD-10-CM | POA: Diagnosis not present

## 2020-09-03 DIAGNOSIS — J301 Allergic rhinitis due to pollen: Secondary | ICD-10-CM | POA: Diagnosis not present

## 2020-09-03 DIAGNOSIS — J3089 Other allergic rhinitis: Secondary | ICD-10-CM | POA: Diagnosis not present

## 2020-09-10 DIAGNOSIS — J3089 Other allergic rhinitis: Secondary | ICD-10-CM | POA: Diagnosis not present

## 2020-09-10 DIAGNOSIS — J301 Allergic rhinitis due to pollen: Secondary | ICD-10-CM | POA: Diagnosis not present

## 2020-09-17 DIAGNOSIS — J3089 Other allergic rhinitis: Secondary | ICD-10-CM | POA: Diagnosis not present

## 2020-09-17 DIAGNOSIS — J301 Allergic rhinitis due to pollen: Secondary | ICD-10-CM | POA: Diagnosis not present

## 2020-09-24 DIAGNOSIS — J301 Allergic rhinitis due to pollen: Secondary | ICD-10-CM | POA: Diagnosis not present

## 2020-09-24 DIAGNOSIS — J3089 Other allergic rhinitis: Secondary | ICD-10-CM | POA: Diagnosis not present

## 2020-10-01 DIAGNOSIS — J301 Allergic rhinitis due to pollen: Secondary | ICD-10-CM | POA: Diagnosis not present

## 2020-10-01 DIAGNOSIS — J3089 Other allergic rhinitis: Secondary | ICD-10-CM | POA: Diagnosis not present

## 2020-10-08 DIAGNOSIS — J301 Allergic rhinitis due to pollen: Secondary | ICD-10-CM | POA: Diagnosis not present

## 2020-10-08 DIAGNOSIS — J3089 Other allergic rhinitis: Secondary | ICD-10-CM | POA: Diagnosis not present

## 2020-10-15 DIAGNOSIS — J3089 Other allergic rhinitis: Secondary | ICD-10-CM | POA: Diagnosis not present

## 2020-10-15 DIAGNOSIS — J301 Allergic rhinitis due to pollen: Secondary | ICD-10-CM | POA: Diagnosis not present

## 2020-10-21 ENCOUNTER — Telehealth (INDEPENDENT_AMBULATORY_CARE_PROVIDER_SITE_OTHER): Payer: Medicare PPO | Admitting: Family Medicine

## 2020-10-21 ENCOUNTER — Encounter: Payer: Self-pay | Admitting: Family Medicine

## 2020-10-21 DIAGNOSIS — R519 Headache, unspecified: Secondary | ICD-10-CM

## 2020-10-21 DIAGNOSIS — R059 Cough, unspecified: Secondary | ICD-10-CM

## 2020-10-21 DIAGNOSIS — R0981 Nasal congestion: Secondary | ICD-10-CM | POA: Diagnosis not present

## 2020-10-21 MED ORDER — BENZONATATE 100 MG PO CAPS: 100.0000 mg | ORAL_CAPSULE | Freq: Three times a day (TID) | ORAL | 0 refills | Status: DC | PRN

## 2020-10-21 MED ORDER — AMOXICILLIN-POT CLAVULANATE 875-125 MG PO TABS: 1.0000 | ORAL_TABLET | Freq: Two times a day (BID) | ORAL | 0 refills | Status: DC

## 2020-10-21 NOTE — Patient Instructions (Signed)
-  I sent the medication(s) we discussed to your pharmacy: Meds ordered this encounter  Medications  . benzonatate (TESSALON PERLES) 100 MG capsule    Sig: Take 1 capsule (100 mg total) by mouth 3 (three) times daily as needed.    Dispense:  20 capsule    Refill:  0  . amoxicillin-clavulanate (AUGMENTIN) 875-125 MG tablet    Sig: Take 1 tablet by mouth 2 (two) times daily.    Dispense:  20 tablet    Refill:  0    Use your albuterol inhaler as needed every 4-6 hours.  I hope you are feeling better soon!  Seek in person care promptly if your symptoms worsen, new concerns arise or you are not improving with treatment.  It was nice to meet you today. I help [place] [place] out with telemedicine visits on Tuesdays and Thursdays and am available for visits on those days. If you have any concerns or questions following this visit please schedule a follow up visit with your Primary Care doctor or seek care at a local urgent care clinic to avoid delays in care.

## 2020-10-21 NOTE — Progress Notes (Signed)
Virtual Visit via Video Note  I connected with Melinda Tran  on 10/21/20 at  3:40 PM EDT by a video enabled telemedicine application and verified that I am speaking with the correct person using two identifiers.  Location patient: home, [place] Location provider:work or home office Persons participating in the virtual visit: patient, provider  I discussed the limitations of evaluation and management by telemedicine and the availability of in person appointments. The patient expressed understanding and agreed to proceed.   HPI:  Acute telemedicine visit for cough, congestion, wheezing: -reports often gets asthma symptoms with weather changes -Onset: about 3 weeks ago -Symptoms include: productive cough now with thick and green and yellow mucus, sinus congestion is also thick with sinus discomfort, having some wheezing at night at times - when she uses her inhaler it helps - using rescue inhaler once per night -Denies:fevers, NVD, CP, SOB, inability to eat/drink/get out of bed, worst headache -Has tried:rescue inhaler, tylenol, on advair for her asthma, flonase, nasal saline, zyrtec, singulair -Pertinent past medical history: allergy,  -Pertinent medication allergies: nkda -COVID-19 vaccine status: fully vaccinated (3 doses); had flu shot  ROS: See pertinent positives and negatives per HPI.  Past Medical History:  Diagnosis Date   Allergy    Anemia    in the past    Asthma    GERD (gastroesophageal reflux disease)    Hyperlipidemia     Past Surgical History:  Procedure Laterality Date   ABDOMINAL HYSTERECTOMY     COLONOSCOPY     POLYPECTOMY       Current Outpatient Medications:    amoxicillin-clavulanate (AUGMENTIN) 875-125 MG tablet, Take 1 tablet by mouth 2 (two) times daily., Disp: 20 tablet, Rfl: 0   benzonatate (TESSALON PERLES) 100 MG capsule, Take 1 capsule (100 mg total) by mouth 3 (three) times daily as needed., Disp: 20 capsule, Rfl: 0   atorvastatin (LIPITOR) 20  MG tablet, Take 1 tablet (20 mg total) by mouth daily., Disp: 90 tablet, Rfl: 3   calcium carbonate 200 MG capsule, Take 250 mg by mouth daily., Disp: , Rfl:    cetirizine (ZYRTEC) 10 MG tablet, Take 10 mg by mouth daily., Disp: , Rfl:    cholecalciferol (VITAMIN D) 1000 UNITS tablet, Take 1,000 Units by mouth daily., Disp: , Rfl:    EPIPEN 2-PAK 0.3 MG/0.3ML SOAJ injection, See admin instructions., Disp: , Rfl:    esomeprazole (NEXIUM) 40 MG capsule, Take 40 mg by mouth daily at 12 noon., Disp: , Rfl:    fluticasone (FLONASE) 50 MCG/ACT nasal spray, 1-2 sprays, Disp: , Rfl:    fluticasone-salmeterol (ADVAIR HFA) 230-21 MCG/ACT inhaler, Inhale 2 puffs into the lungs 2 (two) times daily. , Disp: , Rfl:    Glucosamine-Chondroitin (GLUCOSAMINE CHONDR COMPLEX PO), Take 2 tablets by mouth daily., Disp: , Rfl:    guaiFENesin-codeine (CHERATUSSIN AC) 100-10 MG/5ML syrup, Take 5 mLs by mouth 2 (two) times daily as needed for cough (sedation precautions)., Disp: 120 mL, Rfl: 0   montelukast (SINGULAIR) 10 MG tablet, Take 10 mg by mouth at bedtime., Disp: , Rfl:    Omega-3 1000 MG CAPS, Take 2 capsules by mouth daily., Disp: , Rfl:    predniSONE (DELTASONE) 20 MG tablet, 2 tabs for 4 days, then 1 tab for 4 days, Disp: 12 tablet, Rfl: 0   predniSONE (DELTASONE) 20 MG tablet, 2 tabs po daily for 5 days, then 1 tab po daily for 5 days, Disp: 15 tablet, Rfl: 0   PROAIR RESPICLICK 865 (  90 Base) MCG/ACT AEPB, Inhale 2 puffs into the lungs 4 (four) times daily as needed (dyspnea, cough, wheeze)., Disp: 1 each, Rfl: 6   Spacer/Aero-Holding Chambers (AEROCHAMBER PLUS FLO-VU MEDIUM) MISC, See admin instructions., Disp: , Rfl:    Turmeric 500 MG CAPS, Take by mouth daily., Disp: , Rfl:    vitamin C (ASCORBIC ACID) 500 MG tablet, Take 500 mg by mouth daily., Disp: , Rfl:   EXAM:  VITALS per patient if applicable:  GENERAL: alert, oriented, appears well and in no acute distress  HEENT: atraumatic,  conjunttiva clear, no obvious abnormalities on inspection of external nose and ears  NECK: normal movements of the head and neck  LUNGS: on inspection no signs of respiratory distress, breathing rate appears normal, no obvious gross SOB, gasping or wheezing  CV: no obvious cyanosis  MS: moves all visible extremities without noticeable abnormality  PSYCH/NEURO: pleasant and cooperative, no obvious depression or anxiety, speech and thought processing grossly intact  ASSESSMENT AND PLAN:  Discussed the following assessment and plan:  Nasal sinus congestion  Facial discomfort  Cough  -we discussed possible serious and likely etiologies, options for evaluation and workup, limitations of telemedicine visit vs in person visit, treatment, treatment risks and precautions. Pt prefers to treat via telemedicine empirically rather than in person at this moment.  Query developing sinusitis, bronchitis versus other, possibly secondary to a viral upper respiratory illness or allergies.  She opted for trial of empiric treatment with Augmentin 875 twice daily for 10 days, Tessalon for cough and continuation of her albuterol as needed.   Work/School slipped offered:  declined Scheduled follow up with PCP offered: Agrees to schedule follow-up with her primary care office if needed. Advised to seek prompt in person care if worsening, new symptoms arise, or if is not improving with treatment. Discussed options for inperson care if PCP office not available. Did let this patient know that I only do telemedicine on Tuesdays and Thursdays for [place]. Advised to schedule follow up visit with PCP or [facility] if any further questions or concerns to avoid delays in care.   I discussed the assessment and treatment plan with the patient. The patient was provided an opportunity to ask questions and all were answered. The patient agreed with the plan and demonstrated an understanding of the instructions.     Lucretia Kern,  DO

## 2020-10-29 DIAGNOSIS — J301 Allergic rhinitis due to pollen: Secondary | ICD-10-CM | POA: Diagnosis not present

## 2020-10-29 DIAGNOSIS — J3081 Allergic rhinitis due to animal (cat) (dog) hair and dander: Secondary | ICD-10-CM | POA: Diagnosis not present

## 2020-10-29 DIAGNOSIS — J3089 Other allergic rhinitis: Secondary | ICD-10-CM | POA: Diagnosis not present

## 2020-11-05 DIAGNOSIS — J301 Allergic rhinitis due to pollen: Secondary | ICD-10-CM | POA: Diagnosis not present

## 2020-11-05 DIAGNOSIS — J3089 Other allergic rhinitis: Secondary | ICD-10-CM | POA: Diagnosis not present

## 2020-11-12 DIAGNOSIS — J301 Allergic rhinitis due to pollen: Secondary | ICD-10-CM | POA: Diagnosis not present

## 2020-11-12 DIAGNOSIS — J3089 Other allergic rhinitis: Secondary | ICD-10-CM | POA: Diagnosis not present

## 2020-11-12 DIAGNOSIS — J3081 Allergic rhinitis due to animal (cat) (dog) hair and dander: Secondary | ICD-10-CM | POA: Diagnosis not present

## 2020-11-18 DIAGNOSIS — J301 Allergic rhinitis due to pollen: Secondary | ICD-10-CM | POA: Diagnosis not present

## 2020-11-18 DIAGNOSIS — J3089 Other allergic rhinitis: Secondary | ICD-10-CM | POA: Diagnosis not present

## 2020-11-18 DIAGNOSIS — J3081 Allergic rhinitis due to animal (cat) (dog) hair and dander: Secondary | ICD-10-CM | POA: Diagnosis not present

## 2020-11-24 DIAGNOSIS — H43813 Vitreous degeneration, bilateral: Secondary | ICD-10-CM | POA: Diagnosis not present

## 2020-11-24 DIAGNOSIS — H2513 Age-related nuclear cataract, bilateral: Secondary | ICD-10-CM | POA: Diagnosis not present

## 2020-11-26 DIAGNOSIS — J301 Allergic rhinitis due to pollen: Secondary | ICD-10-CM | POA: Diagnosis not present

## 2020-11-26 DIAGNOSIS — J3089 Other allergic rhinitis: Secondary | ICD-10-CM | POA: Diagnosis not present

## 2020-12-09 DIAGNOSIS — J3089 Other allergic rhinitis: Secondary | ICD-10-CM | POA: Diagnosis not present

## 2020-12-09 DIAGNOSIS — J301 Allergic rhinitis due to pollen: Secondary | ICD-10-CM | POA: Diagnosis not present

## 2020-12-14 ENCOUNTER — Ambulatory Visit: Payer: Medicare PPO | Admitting: Family Medicine

## 2020-12-14 ENCOUNTER — Other Ambulatory Visit: Payer: Self-pay

## 2020-12-14 VITALS — BP 130/78 | HR 81 | Temp 97.4°F | Wt 155.0 lb

## 2020-12-14 DIAGNOSIS — H8111 Benign paroxysmal vertigo, right ear: Secondary | ICD-10-CM | POA: Diagnosis not present

## 2020-12-14 MED ORDER — MECLIZINE HCL 25 MG PO TABS: 12.5000 mg | ORAL_TABLET | Freq: Three times a day (TID) | ORAL | 0 refills | Status: DC | PRN

## 2020-12-14 NOTE — Progress Notes (Signed)
Subjective:     Melinda Tran is a 70 y.o. female presenting for Dizziness (Severe X 4 days ) and Nausea     HPI  #Dizziness - hx of vertigo but not as severe as this - Friday had some symptoms of dizziness but stopped immediately - woke up Saturday morning - and opened eyes and the room was spinning - had to stay in bed all day Saturday/sunday - got up yesterday but still feeling heavy headed - still feeling dizzy with walking - taking dramamine to help with the nausea/dizziness - has had some vomiting - dizziness is improving - when she turns her head to the right triggers the nausea - Saturday night could not hear from the right ear and this is improving   Decreased appetite - bland diet  Review of Systems  Constitutional: Negative for chills and fever.  HENT: Negative for congestion and ear pain.   Respiratory: Negative for cough and shortness of breath.   Gastrointestinal: Positive for nausea and vomiting.     Social History   Tobacco Use  Smoking Status Former Smoker  . Packs/day: 0.25  . Years: 5.00  . Pack years: 1.25  . Types: Cigarettes  . Quit date: 13  . Years since quitting: 45.3  Smokeless Tobacco Never Used        Objective:    BP Readings from Last 3 Encounters:  12/14/20 130/78  11/19/19 132/74  10/14/18 130/76   Wt Readings from Last 3 Encounters:  12/14/20 155 lb (70.3 kg)  07/27/20 155 lb (70.3 kg)  04/14/20 154 lb (69.9 kg)    BP 130/78   Pulse 81   Temp (!) 97.4 F (36.3 C) (Temporal)   Wt 155 lb (70.3 kg)   SpO2 98%   BMI 25.99 kg/m    Physical Exam Constitutional:      General: She is not in acute distress.    Appearance: She is well-developed. She is not diaphoretic.  HENT:     Head: Normocephalic and atraumatic.     Right Ear: Tympanic membrane and external ear normal.     Left Ear: Tympanic membrane and external ear normal.     Nose: Nose normal. No congestion.     Mouth/Throat:     Mouth: Mucous membranes  are moist.  Eyes:     Extraocular Movements: Extraocular movements intact.     Conjunctiva/sclera: Conjunctivae normal.     Pupils: Pupils are equal, round, and reactive to light.     Comments: Nystagmus with leftward gaze to the right  Cardiovascular:     Rate and Rhythm: Normal rate.  Pulmonary:     Effort: Pulmonary effort is normal.  Musculoskeletal:     Cervical back: Neck supple.  Skin:    General: Skin is warm and dry.     Capillary Refill: Capillary refill takes less than 2 seconds.  Neurological:     Mental Status: She is alert. Mental status is at baseline.  Psychiatric:        Mood and Affect: Mood normal.        Behavior: Behavior normal.           Assessment & Plan:   Problem List Items Addressed This Visit      Nervous and Auditory   Benign positional vertigo, right - Primary    No clear trigger and symptoms improving. Discussed meclizine and home exercises. Offered ENT as recurrence for patient with prior episodes but this has been  persisted. ENT referral made. Call if worsening      Relevant Medications   meclizine (ANTIVERT) 25 MG tablet   Other Relevant Orders   Ambulatory referral to ENT       Return in about 3 months (around 03/16/2021) for for annual wellness .  Lesleigh Noe, MD  This visit occurred during the SARS-CoV-2 public health emergency.  Safety protocols were in place, including screening questions prior to the visit, additional usage of staff PPE, and extensive cleaning of exam room while observing appropriate contact time as indicated for disinfecting solutions.

## 2020-12-14 NOTE — Assessment & Plan Note (Signed)
No clear trigger and symptoms improving. Discussed meclizine and home exercises. Offered ENT as recurrence for patient with prior episodes but this has been persisted. ENT referral made. Call if worsening

## 2020-12-14 NOTE — Patient Instructions (Addendum)
Brandt-Daroff Exercise Here's what you need to do for this exercise:  Start in an upright, seated position on your bed. Tilt your head around a 45-degree angle away from the side causing your vertigo. Move into the lying position on one side with your nose pointed up. Stay in this position for about 30 seconds or until the vertigo eases off, whichever is longer. Then move back to the seated position. Repeat on the other side.  You should do these movements from three to five times in a session. You should have three sessions a day for up to 2 weeks, or until the vertigo is gone for 2 days.  Can find videos of this exercise   #Referral I have placed a referral to a specialist for you. You should receive a phone call from the specialty office. Make sure your voicemail is not full and that if you are able to answer your phone to unknown or new numbers.   It may take up to 2 weeks to hear about the referral. If you do not hear anything in 2 weeks, please call our office and ask to speak with the referral coordinator.    #Reflux - try to stop nexium

## 2020-12-17 DIAGNOSIS — J3089 Other allergic rhinitis: Secondary | ICD-10-CM | POA: Diagnosis not present

## 2020-12-17 DIAGNOSIS — J301 Allergic rhinitis due to pollen: Secondary | ICD-10-CM | POA: Diagnosis not present

## 2020-12-24 DIAGNOSIS — J301 Allergic rhinitis due to pollen: Secondary | ICD-10-CM | POA: Diagnosis not present

## 2020-12-24 DIAGNOSIS — J3089 Other allergic rhinitis: Secondary | ICD-10-CM | POA: Diagnosis not present

## 2020-12-27 ENCOUNTER — Encounter: Payer: Self-pay | Admitting: Family Medicine

## 2020-12-30 DIAGNOSIS — J3089 Other allergic rhinitis: Secondary | ICD-10-CM | POA: Diagnosis not present

## 2020-12-30 DIAGNOSIS — J301 Allergic rhinitis due to pollen: Secondary | ICD-10-CM | POA: Diagnosis not present

## 2020-12-31 DIAGNOSIS — H6123 Impacted cerumen, bilateral: Secondary | ICD-10-CM | POA: Diagnosis not present

## 2020-12-31 DIAGNOSIS — R42 Dizziness and giddiness: Secondary | ICD-10-CM | POA: Diagnosis not present

## 2021-01-03 ENCOUNTER — Other Ambulatory Visit: Payer: Self-pay | Admitting: Family Medicine

## 2021-01-03 DIAGNOSIS — E785 Hyperlipidemia, unspecified: Secondary | ICD-10-CM

## 2021-01-04 ENCOUNTER — Ambulatory Visit: Payer: Medicare PPO | Attending: Physician Assistant

## 2021-01-04 ENCOUNTER — Other Ambulatory Visit: Payer: Self-pay

## 2021-01-04 DIAGNOSIS — R42 Dizziness and giddiness: Secondary | ICD-10-CM | POA: Diagnosis not present

## 2021-01-04 DIAGNOSIS — H8113 Benign paroxysmal vertigo, bilateral: Secondary | ICD-10-CM | POA: Insufficient documentation

## 2021-01-04 DIAGNOSIS — R2681 Unsteadiness on feet: Secondary | ICD-10-CM | POA: Diagnosis not present

## 2021-01-04 NOTE — Therapy (Signed)
[facility] [address] [place], [place], [address] Phone: [phone]   Fax:  [phone]  Physical Therapy Evaluation  Patient Details  Name: Melinda Tran MRN: [id number] Date of Birth: 05/15/1951 Referring Provider (PT): Jolene Provost, [place]   Encounter Date: 01/04/2021   PT End of Session - 01/04/21 1001    Visit Number 1    Number of Visits 9    Date for PT Re-Evaluation 03/05/21    Authorization Type [employer]    Authorization Time Period Awaiting Authorization Approval    PT Start Time 1002    PT Stop Time 1050    PT Time Calculation (min) 48 min    Activity Tolerance Patient tolerated treatment well    Behavior During Therapy [facility] for tasks assessed/performed           Past Medical History:  Diagnosis Date  . Allergy   . Anemia    in the past   . Asthma   . GERD (gastroesophageal reflux disease)   . Hyperlipidemia     Past Surgical History:  Procedure Laterality Date  . ABDOMINAL HYSTERECTOMY    . COLONOSCOPY    . POLYPECTOMY      There were no vitals filed for this visit.    Subjective Assessment - 01/04/21 1005    Subjective Patient reports that she right prior to Mother 68, and she began have severe sppining sensation upon wakening and getting out of the bed. Reports sensation lasted seconds. Patient reports she felt her eyes moving. Patient initially had vomitting/nauseous. Patient reports that the issue has been primarily in her R ear. Reports initially hearing changes, but hears better now that the ENT has cleaned out the ears. Denies falls. Patient reports history of tinnitus prior. Patient reports has not taken Meclizine, do to no benefit.    Patient is accompained by: Family member   [name] (Husband)   Pertinent History HLD, GERD, Asthma, HTN    Limitations Standing;Walking    Patient Stated Goals Resolve the Dizziness    Currently in Pain? No/denies               [facility] PT Assessment - 01/04/21 0001      Assessment   Medical Diagnosis Vertigo    Referring Provider (PT) Jolene Provost, PA-C    Onset Date/Surgical Date 12/31/20   referral date   Hand Dominance Right    Prior Therapy None      Precautions   Precautions None      Restrictions   Weight Bearing Restrictions No      Balance Screen   Has the patient fallen in the past 6 months No    Has the patient had a decrease in activity level because of a fear of falling?  Yes    Is the patient reluctant to leave their home because of a fear of falling?  Yes      [place] residence    Living Arrangements Spouse/significant other    Available Help at Discharge Family    Type of [place]    Additional Comments reports no issues/concerns getting around the home at this time      Prior Function   Level of [place] Retired      Associate Professor   Overall Cognitive Status Within Functional Limits for tasks assessed      Observation/Other Assessments   Focus on Therapeutic Outcomes (FOTO)  DPS: 39, DFS 49.6      Sensation   Light Touch Appears Intact    Additional Comments for BUE/BLE      ROM / Strength   AROM / PROM / Strength Strength      Strength   Overall Strength Within functional limits for tasks performed      Transfers   Transfers Sit to Stand;Stand to Sit    Sit to Stand 7: Independent    Stand to Sit 7: Independent      Ambulation/Gait   Ambulation/Gait Yes    Ambulation/Gait Assistance 6: Modified independent (Device/Increase time)    Ambulation Distance (Feet) 100 Feet    Assistive device None    Gait Pattern Within Functional Limits    Ambulation Surface Level;Indoor    Gait Comments patient ambulating into evaluation holding onto spouse arm for support, able to ambulate out without assistance               Vestibular Assessment - 01/04/21 0001      Symptom Behavior   Subjective history of  current problem see subjective    Type of Dizziness  Imbalance;Spinning;Unsteady with head/body turns    Frequency of Dizziness daily; last severe attack was on May 8th    Duration of Dizziness seconds to minutes    Symptom Nature Motion provoked;Intermittent    Aggravating Factors Lying supine;Rolling to right;Sit to stand;Turning head quickly    Relieving Factors Closing eyes;Slow movements;Rest    Progression of Symptoms Better      Oculomotor Exam   Oculomotor Alignment Normal    Ocular ROM WNL    Spontaneous Absent    Gaze-induced  Absent    Smooth Pursuits Intact   reports feeling of heaviness   Saccades Undershoots      Oculomotor Exam-Fixation Suppressed    Left Head Impulse Negative    Right Head Impulse Positive      Vestibulo-Ocular Reflex   VOR 1 Head Only (x 1 viewing) Normal. Mild dizziness after completion    VOR Cancellation Corrective saccades   mild dizziness and heaviness     Positional Testing   Dix-Hallpike Dix-Hallpike Right;Dix-Hallpike Left      Dix-Hallpike Right   Dix-Hallpike Right Duration 15 seconds    Dix-Hallpike Right Symptoms Upbeat, right rotatory nystagmus      Dix-Hallpike Left   Dix-Hallpike Left Duration 60 seconds    Dix-Hallpike Left Symptoms Upbeat, left rotatory nystagmus              Objective measurements completed on examination: See above findings.        Vestibular Treatment/Exercise - 01/04/21 0001      Vestibular Treatment/Exercise   Vestibular Treatment Provided Canalith Repositioning    Canalith Repositioning Epley Manuever Right       EPLEY MANUEVER RIGHT   Number of Reps  3    Overall Response Improved Symptoms    Response Details  reduced intensity of nystagmus; still present upon reassesment                 PT Education - 01/04/21 1053    Education Details Educated on [employer]; BPPV and What to Expect after Repositioning    Person(s) Educated Patient;Spouse    Methods  Explanation;Handout    Comprehension Verbalized understanding            PT Short Term Goals - 01/04/21 1109      PT SHORT TERM GOAL #1   Title patient will  undergo further vestibuar assessment (DVA/MSQ) and LTG to be set as applicable    Baseline TBA    Time 4    Period Weeks    Status New    Target Date 02/01/21             PT Long Term Goals - 01/04/21 1110      PT LONG TERM GOAL #1   Title Patient will improve DPS > 50 (ALL LTGs Due: 03/05/21)    Baseline DPS: 39    Time 8    Period Weeks    Status New    Target Date 03/05/21      PT LONG TERM GOAL #2   Title Patient will demonstrate (-) positional testing to indicative resolution of BPPV    Baseline (+) positional testing    Time 8    Period Weeks    Status New      PT LONG TERM GOAL #3   Title LTG to be set for MSQ/DVA as applicable    Baseline TBA    Time 8    Period Weeks    Status New      PT LONG TERM GOAL #4   Title Patient will report 50% improvement in dizziness to allow for return to functional activities    Baseline unable to drive    Time 8    Period Weeks    Status New                  Plan - 01/04/21 1100    Clinical Impression Statement Patient is a 70 y.o. female referred to Neuro OPPT services for Vertigo. Patient's PMH significant for the following: HLD, GERD, Asthma, HTN. Upon evaluation patient presenting with the following impairments: dizziness, Positive Right HIT, abnormal oculomotor exam and decreased balance. Upon positional testing, patient presenting with R Upbeating Rotary Nystagmus of Short Duraction indicating R Posterior Canal Canalithiasis and L Upbeating Rotary Nystagmus lasting > 60 seconds indicative of L Posterior Canal Cupolithasis. Patient will benefit from skilled PT services to address impairments and improved tolerance for functional activities.    Personal Factors and Comorbidities Comorbidity 2    Comorbidities HLD, GERD, Asthma, HTN     Examination-Activity Limitations Bed Mobility;Stand;Locomotion Level    Examination-Participation Restrictions Community Activity;Driving;Cleaning    Stability/Clinical Decision Making Stable/Uncomplicated    Clinical Decision Making Low    Rehab Potential Good    PT Frequency 1x / week    PT Duration 8 weeks    PT Treatment/Interventions Canalith Repostioning;Cryotherapy;Electrical Stimulation;Moist Heat;DME Instruction;Gait training;Stair training;Functional mobility training;Therapeutic activities;Therapeutic exercise;Balance training;Neuromuscular re-education;Patient/family education;Dry needling;Passive range of motion;Manual techniques;Vestibular    PT Next Visit Plan Reassess R/L Posterior Canal BPPV. Assess Horizontal Canal. Treat as indicated. Assess DVA and update LTG.    Consulted and Agree with Plan of Care Patient           Patient will benefit from skilled therapeutic intervention in order to improve the following deficits and impairments:  Decreased balance,Dizziness,Decreased activity tolerance  Visit Diagnosis: Dizziness and giddiness  BPPV (benign paroxysmal positional vertigo), bilateral  Unsteadiness on feet     Problem List Patient Active Problem List   Diagnosis Date Noted  . Benign positional vertigo, right 12/14/2020  . RSV bronchitis 04/14/2020  . Blood glucose elevated 11/19/2019  . Colon polyps 11/19/2019  . Chronic cough 07/23/2019  . Asthma exacerbation 06/20/2019  . Hordeolum externum of right upper eyelid 10/14/2018  . Estrogen deficiency 04/29/2018  . Asthmatic bronchitis ,  chronic ([place]) 04/29/2018  . GERD (gastroesophageal reflux disease) 05/27/2015  . HLD (hyperlipidemia) 06/11/2014  . Allergic asthma 04/16/2014  . Family history of colon cancer in mother 04/16/2014  . Disorder of bone and cartilage 07/10/2007    Jones Bales, PT, DPT 01/04/2021, 11:17 AM  [facility] [address] [place], [place], [address] Phone: [phone]   Fax:  [phone]  Name: Melinda Tran MRN: [id number] Date of Birth: 02/23/1951

## 2021-01-07 DIAGNOSIS — J301 Allergic rhinitis due to pollen: Secondary | ICD-10-CM | POA: Diagnosis not present

## 2021-01-07 DIAGNOSIS — J3089 Other allergic rhinitis: Secondary | ICD-10-CM | POA: Diagnosis not present

## 2021-01-12 ENCOUNTER — Other Ambulatory Visit: Payer: Self-pay

## 2021-01-12 ENCOUNTER — Ambulatory Visit: Payer: Medicare PPO | Attending: Physician Assistant

## 2021-01-12 DIAGNOSIS — R42 Dizziness and giddiness: Secondary | ICD-10-CM | POA: Insufficient documentation

## 2021-01-12 DIAGNOSIS — H8113 Benign paroxysmal vertigo, bilateral: Secondary | ICD-10-CM | POA: Insufficient documentation

## 2021-01-12 DIAGNOSIS — R2681 Unsteadiness on feet: Secondary | ICD-10-CM | POA: Diagnosis not present

## 2021-01-12 NOTE — Patient Instructions (Addendum)
Sit to Side-Lying    Sit on edge of bed. 1. Turn head 45 to right. 2. Maintain head position and lie down slowly on left side. Hold until symptoms subside. 3. Sit up slowly. Hold until symptoms subside. 4. Turn head 45 to left. 5. Maintain head position and lie down slowly on right side. Hold until symptoms subside. 6. Sit up slowly. Repeat sequence 5 times per session. Do 2 sessions per day.  Copyright  VHI. All rights reserved.    Bending / Picking Up Objects    Sitting, slowly bend head down and pick up object on the floor. Return to upright position. Hold position until symptoms subside. Repeat 5 times per session. Do 2 sessions per day.  Copyright  VHI. All rights reserved.   Gaze Stabilization: Sitting    Keeping eyes on target on wall 3-4 feet away, tilt head down 15-30 and move head side to side for 30 seconds. Repeat while moving head up and down for 30 seconds. Do 2-3 sessions per day.    Gaze Stabilization: Tip Card  1.Target must remain in focus, not blurry, and appear stationary while head is in motion. 2.Perform exercises with small head movements (45 to either side of midline). 3.Increase speed of head motion so long as target is in focus. 4.If you wear eyeglasses, be sure you can see target through lens (therapist will give specific instructions for bifocal / progressive lenses). 5.These exercises may provoke dizziness or nausea. Work through these symptoms. If too dizzy, slow head movement slightly. Rest between each exercise. 6.Exercises demand concentration; avoid distractions. 7.For safety, perform standing exercises close to a counter, wall, corner, or next to someone.

## 2021-01-12 NOTE — Therapy (Signed)
[facility] [address] [place], [place], [address] Phone: [phone]   Fax:  [phone]  Physical Therapy Treatment  Patient Details  Name: Melinda Tran MRN: [id number] Date of Birth: 1951-04-25 Referring Provider (Melinda Tran): Jolene Provost, [place]   Encounter Date: 01/12/2021   Melinda Tran End of Session - 01/12/21 1449    Visit Number 2    Number of Visits 9    Date for Melinda Tran Re-Evaluation 03/05/21    Authorization Type [employer]    Melinda Tran Start Time 1448    Melinda Tran Stop Time 1528    Melinda Tran Time Calculation (min) 40 min    Activity Tolerance Patient tolerated treatment well    Behavior During Therapy [facility] for tasks assessed/performed           Past Medical History:  Diagnosis Date  . Allergy   . Anemia    in the past   . Asthma   . GERD (gastroesophageal reflux disease)   . Hyperlipidemia     Past Surgical History:  Procedure Laterality Date  . ABDOMINAL HYSTERECTOMY    . COLONOSCOPY    . POLYPECTOMY      There were no vitals filed for this visit.   Subjective Assessment - 01/12/21 1450    Subjective Reports no severe episodes of spinning, but still have some mild dizziness. No other new changes/complaints.    Patient is accompained by: Family member   Melinda Tran (Husband)   Pertinent History HLD, GERD, Asthma, HTN    Limitations Standing;Walking    Patient Stated Goals Resolve the Dizziness    Currently in Pain? No/denies             Vestibular Assessment - 01/12/21 0001      Visual Acuity   Static 8    Dynamic 4      Positional Testing   Dix-Hallpike Dix-Hallpike Right;Dix-Hallpike Left    Sidelying Test Sidelying Right;Sidelying Left    Horizontal Canal Testing Horizontal Canal Right;Horizontal Canal Left      Dix-Hallpike Right   Dix-Hallpike Right Duration 0    Dix-Hallpike Right Symptoms No nystagmus      Dix-Hallpike Left   Dix-Hallpike Left Duration 0    Dix-Hallpike Left Symptoms No nystagmus       Sidelying Right   Sidelying Right Duration 0    Sidelying Right Symptoms No nystagmus      Sidelying Left   Sidelying Left Duration 0    Sidelying Left Symptoms No nystagmus      Horizontal Canal Right   Horizontal Canal Right Duration 0    Horizontal Canal Right Symptoms Normal      Horizontal Canal Left   Horizontal Canal Left Duration 0    Horizontal Canal Left Symptoms Normal            [facility] Adult Melinda Tran Treatment/Exercise - 01/12/21 0001      Therapeutic Activites    Therapeutic Activities Other Therapeutic Activities    Other Therapeutic Activities Completed habituation of bending down to pick up objects (cones from floor), completed x 3 reps bilaterally. Increased dizziness with bending to R side.           Vestibular Treatment/Exercise - 01/12/21 0001      Vestibular Treatment/Exercise   Vestibular Treatment Provided Habituation;Gaze    Habituation Exercises Melinda Tran Daroff;Comment    Gaze Exercises X1 Viewing Horizontal;X1 Viewing Vertical      Melinda Tran   Number of Reps  3  Symptom Description  completed x 3 reps to bilateral directions; mild dizziness with completion R side. Added to HEP and educated on proper completion.      X1 Viewing Horizontal   Foot Position seated    Reps 2    Comments x 15-20 seconds due to increased dizziness      X1 Viewing Vertical   Foot Position seated    Reps 2    Comments x 30 seconds; felt more dizziness with upward motion of head            Sit to Side-Lying    Sit on edge of bed. 1. Turn head 45 to right. 2. Maintain head position and lie down slowly on left side. Hold until symptoms subside. 3. Sit up slowly. Hold until symptoms subside. 4. Turn head 45 to left. 5. Maintain head position and lie down slowly on right side. Hold until symptoms subside. 6. Sit up slowly. Repeat sequence 5 times per session. Do 2 sessions per day.  Copyright  VHI. All rights reserved.    Bending / Picking Up  Objects    Sitting, slowly bend head down and pick up object on the floor. Return to upright position. Hold position until symptoms subside. Repeat 5 times per session. Do 2 sessions per day.  Copyright  VHI. All rights reserved.   Gaze Stabilization: Sitting    Keeping eyes on target on wall 3-4 feet away, tilt head down 15-30 and move head side to side for 30 seconds. Repeat while moving head up and down for 30 seconds. Do 2-3 sessions per day.   Gaze Stabilization: Tip Card  1.Target must remain in focus, not blurry, and appear stationary while head is in motion. 2.Perform exercises with small head movements (45 to either side of midline). 3.Increase speed of head motion so long as target is in focus. 4.If you wear eyeglasses, be sure you can see target through lens (therapist will give specific instructions for bifocal / progressive lenses). 5.These exercises may provoke dizziness or nausea. Work through these symptoms. If too dizzy, slow head movement slightly. Rest between each exercise. 6.Exercises demand concentration; avoid distractions. 7.For safety, perform standing exercises close to a counter, wall, corner, or next to someone.      Melinda Tran Education - 01/12/21 1531    Education Details BPPV Resolved, Potential for Reoccurence; Initial HEP    Person(s) Educated Patient;Spouse    Methods Explanation;Demonstration;Handout    Comprehension Verbalized understanding;Returned demonstration            Melinda Tran Short Term Goals - 01/04/21 1109      Melinda Tran SHORT TERM GOAL #1   Title patient will undergo further vestibuar assessment (DVA/MSQ) and LTG to be set as applicable    Baseline TBA    Time 4    Period Weeks    Status New    Target Date 02/01/21             Melinda Tran Long Term Goals - 01/04/21 1110      Melinda Tran LONG TERM GOAL #1   Title Patient will improve DPS > 50 (ALL LTGs Due: 03/05/21)    Baseline DPS: 39    Time 8    Period Weeks    Status New    Target Date  03/05/21      Melinda Tran LONG TERM GOAL #2   Title Patient will demonstrate (-) positional testing to indicative resolution of BPPV    Baseline (+) positional testing  Time 8    Period Weeks    Status New      Melinda Tran LONG TERM GOAL #3   Title LTG to be set for MSQ/DVA as applicable    Baseline TBA    Time 8    Period Weeks    Status New      Melinda Tran LONG TERM GOAL #4   Title Patient will report 50% improvement in dizziness to allow for return to functional activities    Baseline unable to drive    Time 8    Period Weeks    Status New                 Plan - 01/12/21 1533    Clinical Impression Statement Reassessed all canals bilaterally today with no nystagmus/symptoms indicating resolution of BPPV. Patient demosntrating 4 line difference on DVA today. Initiated HEP focused on habituation activities and VOR x 1. Patient tolerating well. Will contine to progress toward all LTGs.    Personal Factors and Comorbidities Comorbidity 2    Comorbidities HLD, GERD, Asthma, HTN    Examination-Activity Limitations Bed Mobility;Stand;Locomotion Level    Examination-Participation Restrictions Community Activity;Driving;Cleaning    Stability/Clinical Decision Making Stable/Uncomplicated    Rehab Potential Good    Melinda Tran Frequency 1x / week    Melinda Tran Duration 8 weeks    Melinda Tran Treatment/Interventions Canalith Repostioning;Cryotherapy;Electrical Stimulation;Moist Heat;DME Instruction;Gait training;Stair training;Functional mobility training;Therapeutic activities;Therapeutic exercise;Balance training;Neuromuscular re-education;Patient/family education;Dry needling;Passive range of motion;Manual techniques;Vestibular    Melinda Tran Next Visit Plan Assess MSQ. Reassess BPPV as needed. How was HEP? Review and continue to progress habituation/VOR x1.    Consulted and Agree with Plan of Care Patient           Patient will benefit from skilled therapeutic intervention in order to improve the following deficits and  impairments:  Decreased balance,Dizziness,Decreased activity tolerance  Visit Diagnosis: Dizziness and giddiness  BPPV (benign paroxysmal positional vertigo), bilateral  Unsteadiness on feet     Problem List Patient Active Problem List   Diagnosis Date Noted  . Benign positional vertigo, right 12/14/2020  . RSV bronchitis 04/14/2020  . Blood glucose elevated 11/19/2019  . Colon polyps 11/19/2019  . Chronic cough 07/23/2019  . Asthma exacerbation 06/20/2019  . Hordeolum externum of right upper eyelid 10/14/2018  . Estrogen deficiency 04/29/2018  . Asthmatic bronchitis , chronic ([place]) 04/29/2018  . GERD (gastroesophageal reflux disease) 05/27/2015  . HLD (hyperlipidemia) 06/11/2014  . Allergic asthma 04/16/2014  . Family history of colon cancer in mother 04/16/2014  . Disorder of bone and cartilage 07/10/2007    Melinda Tran, Melinda Tran, Melinda Tran 01/12/2021, 3:40 PM  [facility] [address] [place], [place], [address] Phone: [phone]   Fax:  [phone]  Name: Melinda Tran MRN: [id number] Date of Birth: [birth year]

## 2021-01-13 DIAGNOSIS — J301 Allergic rhinitis due to pollen: Secondary | ICD-10-CM | POA: Diagnosis not present

## 2021-01-13 DIAGNOSIS — J3089 Other allergic rhinitis: Secondary | ICD-10-CM | POA: Diagnosis not present

## 2021-01-18 ENCOUNTER — Other Ambulatory Visit: Payer: Self-pay

## 2021-01-18 ENCOUNTER — Ambulatory Visit: Payer: Medicare PPO

## 2021-01-18 DIAGNOSIS — R42 Dizziness and giddiness: Secondary | ICD-10-CM | POA: Diagnosis not present

## 2021-01-18 DIAGNOSIS — R2681 Unsteadiness on feet: Secondary | ICD-10-CM

## 2021-01-18 DIAGNOSIS — H8113 Benign paroxysmal vertigo, bilateral: Secondary | ICD-10-CM | POA: Diagnosis not present

## 2021-01-18 NOTE — Therapy (Signed)
[facility] [address] [place], [place], [address] Phone: [phone]   Fax:  [phone]  Physical Therapy Treatment  Patient Details  Name: TKAI LARGE MRN: [id number] Date of Birth: [birth year] Referring Provider (PT): Jolene Provost, [place]   Encounter Date: 01/18/2021   PT End of Session - 01/18/21 1149     Visit Number 3    Number of Visits 9    Date for PT Re-Evaluation 03/05/21    Authorization Type [employer]    PT Start Time 1149   patient arriving late   PT Stop Time 75    PT Time Calculation (min) 41 min    Activity Tolerance Patient tolerated treatment well    Behavior During Therapy [facility] for tasks assessed/performed             Past Medical History:  Diagnosis Date   Allergy    Anemia    in the past    Asthma    GERD (gastroesophageal reflux disease)    Hyperlipidemia     Past Surgical History:  Procedure Laterality Date   ABDOMINAL HYSTERECTOMY     COLONOSCOPY     POLYPECTOMY      There were no vitals filed for this visit.   Subjective Assessment - 01/18/21 1150     Subjective Patient reports ear ache in the R ear that started over the weekend. Patient reports no other new changes/complaints. Has had no episodes of the spinning sensation. Does report feeling off balanced. Reports exercises are going well.    Patient is accompained by: Family member   [name] (Husband)   Pertinent History HLD, GERD, Asthma, HTN    Limitations Standing;Walking    Patient Stated Goals Resolve the Dizziness    Currently in Pain? No/denies                   [facility] Adult PT Treatment/Exercise - 01/18/21 0001       Transfers   Transfers Sit to Stand;Stand to Sit    Sit to Stand 7: Independent    Stand to Sit 7: Independent      Ambulation/Gait   Ambulation/Gait Yes    Ambulation/Gait Assistance 5: Supervision    Ambulation/Gait Assistance Details throughout therapy gym and with  high level balance    Assistive device None    Gait Pattern Within Functional Limits    Ambulation Surface Level;Indoor      High Level Balance   High Level Balance Activities Head turns    High Level Balance Comments Completed ambulation with horizontal head turns at countertop x 3 laps down and back, cues for starting at slow speed and will progress speed as balance improves. No dizziness reported. Added to HEP      Therapeutic Activites    Therapeutic Activities Other Therapeutic Activities    Other Therapeutic Activities Completed habituation of bending down to pick up objects from standing position (cones from floor), completed x 3 reps forward to L/R, and then second set completed with bending to R side due to increased dizziness (5/10) at end of completion. Resolution noted quickly.             Vestibular Treatment/Exercise - 01/18/21 0001       Vestibular Treatment/Exercise   Vestibular Treatment Provided Gaze      X1 Viewing Horizontal   Foot Position seated > standing    Reps 2    Comments 2 x 30 seconds then progressed to  1 x 60 seconds. no dizziness      X1 Viewing Vertical   Foot Position seated > standing    Reps 2    Comments 2 x 30 seconds then progressed to 1 x 60 seconds. no dizziness                Balance Exercises - 01/18/21 0001       Balance Exercises: Standing   Standing Eyes Opened Narrow base of support (BOS);Head turns;Solid surface;Limitations   horizontal/vertical head turns x 10 reps each; increased challenge with horizontal > vertical, specifically noted with head turn to R   Standing Eyes Closed Narrow base of support (BOS);Solid surface;3 reps;30 secs;Limitations    Standing Eyes Closed Limitations increased postural sway noted with vision removed.            VOR Progression and Balance HEP Initiated during today's session:   Gaze Stabilization: Standing Feet Apart    Feet shoulder width apart, keeping eyes on target on  wall 3-4 feet away, tilt head down 15-30 and move head side to side for 60 seconds. Repeat while moving head up and down for 60 seconds. Do 2-3 sessions per day.   Copyright  VHI. All rights reserved.      Access Code: [email or web address] URL: [email or web address] Date: 01/18/2021 Prepared by: Baldomero Lamy   Exercises Romberg Stance with Eyes Closed - 1 x daily - 7 x weekly - 1 sets - 3 reps - 30 seconds hold Romberg Stance with Head Nods - 1 x daily - 7 x weekly - 2 sets - 10 reps Romberg Stance with Head Rotation - 1 x daily - 7 x weekly - 2 sets - 10 reps Walking with Head Rotation - 1 x daily - 7 x weekly - 1 sets - 3 reps     PT Education - 01/18/21 1238     Education Details HEP Progression    Person(s) Educated Patient;Spouse    Methods Explanation;Demonstration;Handout    Comprehension Verbalized understanding;Returned demonstration              PT Short Term Goals - 01/04/21 1109       PT SHORT TERM GOAL #1   Title patient will undergo further vestibuar assessment (DVA/MSQ) and LTG to be set as applicable    Baseline TBA    Time 4    Period Weeks    Status New    Target Date 02/01/21               PT Long Term Goals - 01/04/21 1110       PT LONG TERM GOAL #1   Title Patient will improve DPS > 50 (ALL LTGs Due: 03/05/21)    Baseline DPS: 39    Time 8    Period Weeks    Status New    Target Date 03/05/21      PT LONG TERM GOAL #2   Title Patient will demonstrate (-) positional testing to indicative resolution of BPPV    Baseline (+) positional testing    Time 8    Period Weeks    Status New      PT LONG TERM GOAL #3   Title LTG to be set for MSQ/DVA as applicable    Baseline TBA    Time 8    Period Weeks    Status New      PT LONG TERM GOAL #4   Title Patient will report 50% improvement in dizziness to  allow for return to functional activities    Baseline unable to drive    Time 8    Period Weeks    Status New                    Plan - 01/18/21 1236     Clinical Impression Statement Continue to deny vertigo/spinning sensation, therefore no BPPV assesment today. Continued focus on progression of Gaze Stabilization and habituation with patient tolerating well. Initiated balance HEP focused on head turns and vision remvoed to further challenge vestibular input. Will continue to progress toward all LTGs.    Personal Factors and Comorbidities Comorbidity 2    Comorbidities HLD, GERD, Asthma, HTN    Examination-Activity Limitations Bed Mobility;Stand;Locomotion Level    Examination-Participation Restrictions Community Activity;Driving;Cleaning    Stability/Clinical Decision Making Stable/Uncomplicated    Rehab Potential Good    PT Frequency 1x / week    PT Duration 8 weeks    PT Treatment/Interventions Canalith Repostioning;Cryotherapy;Electrical Stimulation;Moist Heat;DME Instruction;Gait training;Stair training;Functional mobility training;Therapeutic activities;Therapeutic exercise;Balance training;Neuromuscular re-education;Patient/family education;Dry needling;Passive range of motion;Manual techniques;Vestibular    PT Next Visit Plan How was balance HEP?  continue to progress habituation/VOR x1. gait with head turns. high level balance. corner balance (progress to complaint surface)    Consulted and Agree with Plan of Care Patient             Patient will benefit from skilled therapeutic intervention in order to improve the following deficits and impairments:  Decreased balance, Dizziness, Decreased activity tolerance  Visit Diagnosis: Unsteadiness on feet  Dizziness and giddiness     Problem List Patient Active Problem List   Diagnosis Date Noted   Benign positional vertigo, right 12/14/2020   RSV bronchitis 04/14/2020   Blood glucose elevated 11/19/2019   Colon polyps 11/19/2019   Chronic cough 07/23/2019   Asthma exacerbation 06/20/2019   Hordeolum externum of right upper eyelid  10/14/2018   Estrogen deficiency 04/29/2018   Asthmatic bronchitis , chronic ([place]) 04/29/2018   GERD (gastroesophageal reflux disease) 05/27/2015   HLD (hyperlipidemia) 06/11/2014   Allergic asthma 04/16/2014   Family history of colon cancer in mother 04/16/2014   Disorder of bone and cartilage 07/10/2007    Jones Bales, PT, DPT 01/18/2021, 12:40 PM  [facility] [address] [place], [place], [address] Phone: [phone]   Fax:  [phone]  Name: CEAIRA ERNSTER MRN: [id number] Date of Birth: November 08, 1950

## 2021-01-18 NOTE — Patient Instructions (Addendum)
Gaze Stabilization: Standing Feet Apart    Feet shoulder width apart, keeping eyes on target on wall 3-4 feet away, tilt head down 15-30 and move head side to side for 60 seconds. Repeat while moving head up and down for 60 seconds. Do 2-3 sessions per day.   Copyright  VHI. All rights reserved.    Access Code: [email or web address] URL: [email or web address] Date: 01/18/2021 Prepared by: Baldomero Lamy  Exercises Romberg Stance with Eyes Closed - 1 x daily - 7 x weekly - 1 sets - 3 reps - 30 seconds hold Romberg Stance with Head Nods - 1 x daily - 7 x weekly - 2 sets - 10 reps Romberg Stance with Head Rotation - 1 x daily - 7 x weekly - 2 sets - 10 reps Walking with Head Rotation - 1 x daily - 7 x weekly - 1 sets - 3 reps

## 2021-01-21 DIAGNOSIS — J3089 Other allergic rhinitis: Secondary | ICD-10-CM | POA: Diagnosis not present

## 2021-01-21 DIAGNOSIS — H1045 Other chronic allergic conjunctivitis: Secondary | ICD-10-CM | POA: Diagnosis not present

## 2021-01-21 DIAGNOSIS — J301 Allergic rhinitis due to pollen: Secondary | ICD-10-CM | POA: Diagnosis not present

## 2021-01-21 DIAGNOSIS — J4530 Mild persistent asthma, uncomplicated: Secondary | ICD-10-CM | POA: Diagnosis not present

## 2021-01-24 ENCOUNTER — Ambulatory Visit: Payer: Medicare PPO

## 2021-01-24 ENCOUNTER — Other Ambulatory Visit: Payer: Self-pay

## 2021-01-24 DIAGNOSIS — R42 Dizziness and giddiness: Secondary | ICD-10-CM

## 2021-01-24 DIAGNOSIS — R2681 Unsteadiness on feet: Secondary | ICD-10-CM | POA: Diagnosis not present

## 2021-01-24 DIAGNOSIS — H8113 Benign paroxysmal vertigo, bilateral: Secondary | ICD-10-CM | POA: Diagnosis not present

## 2021-01-24 NOTE — Therapy (Signed)
[facility] [address] [place], [place], [address] Phone: [phone]   Fax:  [phone]  Physical Therapy Treatment  Patient Details  Name: Melinda Tran MRN: [id number] Date of Birth: [birth year] Referring Provider (PT): Jolene Provost, [place]   Encounter Date: 01/24/2021   PT End of Session - 01/24/21 0936     Visit Number 4    Number of Visits 9    Date for PT Re-Evaluation 03/05/21    Authorization Type [employer]    PT Start Time 0931    PT Stop Time 1014    PT Time Calculation (min) 43 min    Activity Tolerance Patient tolerated treatment well    Behavior During Therapy [facility] for tasks assessed/performed             Past Medical History:  Diagnosis Date   Allergy    Anemia    in the past    Asthma    GERD (gastroesophageal reflux disease)    Hyperlipidemia     Past Surgical History:  Procedure Laterality Date   ABDOMINAL HYSTERECTOMY     COLONOSCOPY     POLYPECTOMY      There were no vitals filed for this visit.   Subjective Assessment - 01/24/21 0934     Subjective Patient reports that she feels like she is getting better overall. Exercises are going well, still no reports of spinning sensation.    Patient is accompained by: Family member   Melinda Tran (Husband)   Pertinent History HLD, GERD, Asthma, HTN    Limitations Standing;Walking    Patient Stated Goals Resolve the Dizziness    Currently in Pain? No/denies                     Vestibular Assessment - 01/24/21 0001       Positional Sensitivities   Sit to Supine No dizziness    Supine to Left Side No dizziness    Supine to Right Side No dizziness    Supine to Sitting No dizziness    Right Hallpike No dizziness   reports pressure   Up from Right Hallpike No dizziness    Up from Left Hallpike No dizziness    Nose to Right Knee No dizziness    Right Knee to Sitting No dizziness   reports pressure   Nose to Left  Knee No dizziness    Left Knee to Sitting No dizziness    Head Turning x 5 No dizziness    Head Nodding x 5 No dizziness    Pivot Right in Standing No dizziness    Pivot Left in Standing No dizziness    Rolling Right No dizziness   reports pressure   Rolling Left No dizziness    Positional Sensitivities Comments no dizziness reports; just pressure increase reported.               [facility] Adult PT Treatment/Exercise - 01/24/21 0001       Ambulation/Gait   Ambulation/Gait Yes    Ambulation/Gait Assistance 5: Supervision    Ambulation/Gait Assistance Details throughout therapy gym with activities    Ambulation Distance (Feet) --   clinic distance   Assistive device None    Gait Pattern Within Functional Limits    Ambulation Surface Level;Indoor      High Level Balance   High Level Balance Activities Head turns    High Level Balance Comments Completed ambulation with horizontal/vertical head turns x 115  ft each. Increased challenge with vertical initially, but demo improvements with incrased distance.              Balance Exercises - 01/24/21 0001       Balance Exercises: Standing   Standing Eyes Opened Wide (BOA);Head turns;Foam/compliant surface   horizontal/vertical head turns x 10 reps on airex.   Standing Eyes Closed Wide (BOA);Foam/compliant surface;3 reps;30 secs;Limitations   increased postural sway on airex;   Tandem Stance Eyes open;2 reps;30 secs;Limitations    Tandem Stance Time completed eyes open 2 x 30 seconds, then progressed to addition of horizontal head turns x 10 reps alternating foot position with EO. Added to HEP and educated on proper completion.    SLS with Vectors Foam/compliant surface;Intermittent upper extremity assist;Limitations    SLS with Vectors Limitations standing on airex completing alternating toe taps to cones x 15 reps bilaterally, progressing from single UE support to no UE support. CGA.    Rockerboard  Anterior/posterior;Lateral;EO;Limitations    Rockerboard Limitations standing on rockerboard positioned A/P and laterally completed eyes open with weight shift with no UE support, cues for proper technique and to promote improved use of ankle > hip strategy. completed x 15 reps to each direction.            Updated HEP:  Access Code: [email or web address] URL: [email or web address] Date: 01/24/2021 Prepared by: Baldomero Lamy  Exercises Romberg Stance with Eyes Closed - 1 x daily - 7 x weekly - 1 sets - 3 reps - 30 seconds hold Romberg Stance with Head Nods - 1 x daily - 7 x weekly - 2 sets - 10 reps Romberg Stance with Head Rotation - 1 x daily - 7 x weekly - 2 sets - 10 reps Walking with Head Rotation - 1 x daily - 7 x weekly - 1 sets - 3 reps Tandem Stance with Head Rotation - 1 x daily - 7 x weekly - 2 sets - 10 reps    PT Education - 01/24/21 1015     Education Details Addition to HEP    Person(s) Educated Patient    Methods Explanation;Demonstration;Handout    Comprehension Verbalized understanding              PT Short Term Goals - 01/24/21 1018       PT SHORT TERM GOAL #1   Title patient will undergo further vestibuar assessment (DVA/MSQ) and LTG to be set as applicable    Baseline Assessed and LTG set    Time 4    Period Weeks    Status Achieved    Target Date 02/01/21               PT Long Term Goals - 01/24/21 1019       PT LONG TERM GOAL #1   Title Patient will improve DPS > 50 (ALL LTGs Due: 03/05/21)    Baseline DPS: 39    Time 8    Period Weeks    Status New      PT LONG TERM GOAL #2   Title Patient will demonstrate (-) positional testing to indicative resolution of BPPV    Baseline (+) positional testing    Time 8    Period Weeks    Status New      PT LONG TERM GOAL #3   Title Patient will demonstrate </= 2 line difference with DVA to demo improvements with VOR    Baseline 4 Line Difference    Time 8  Period Weeks    Status  Revised      PT LONG TERM GOAL #4   Title Patient will report 50% improvement in dizziness to allow for return to functional activities    Baseline unable to drive    Time 8    Period Weeks    Status New                   Plan - 01/24/21 1016     Clinical Impression Statement Today's skilled PT session focused on assesment of MSQ, overall no dizziness but continues to experience mild sensation of pressure in head with certain positions. Today's session focused on review of balance HEP and high level balance activities with patient tolerating well. Patient tolerating progression to complaint surfaces well. Updated HEP to include tandem stance, will continue to progress toward all LTGs.    Personal Factors and Comorbidities Comorbidity 2    Comorbidities HLD, GERD, Asthma, HTN    Examination-Activity Limitations Bed Mobility;Stand;Locomotion Level    Examination-Participation Restrictions Community Activity;Driving;Cleaning    Stability/Clinical Decision Making Stable/Uncomplicated    Rehab Potential Good    PT Frequency 1x / week    PT Duration 8 weeks    PT Treatment/Interventions Canalith Repostioning;Cryotherapy;Electrical Stimulation;Moist Heat;DME Instruction;Gait training;Stair training;Functional mobility training;Therapeutic activities;Therapeutic exercise;Balance training;Neuromuscular re-education;Patient/family education;Dry needling;Passive range of motion;Manual techniques;Vestibular    PT Next Visit Plan How was balance HEP?  continue to progress habituation/VOR x1. gait with head turns. high level balance. corner balance (progress to complaint surface). Balance on outdoor surfaces.    Consulted and Agree with Plan of Care Patient             Patient will benefit from skilled therapeutic intervention in order to improve the following deficits and impairments:  Decreased balance, Dizziness, Decreased activity tolerance  Visit Diagnosis: Unsteadiness on  feet  Dizziness and giddiness     Problem List Patient Active Problem List   Diagnosis Date Noted   Benign positional vertigo, right 12/14/2020   RSV bronchitis 04/14/2020   Blood glucose elevated 11/19/2019   Colon polyps 11/19/2019   Chronic cough 07/23/2019   Asthma exacerbation 06/20/2019   Hordeolum externum of right upper eyelid 10/14/2018   Estrogen deficiency 04/29/2018   Asthmatic bronchitis , chronic ([place]) 04/29/2018   GERD (gastroesophageal reflux disease) 05/27/2015   HLD (hyperlipidemia) 06/11/2014   Allergic asthma 04/16/2014   Family history of colon cancer in mother 04/16/2014   Disorder of bone and cartilage 07/10/2007    Jones Bales, PT, DPT 01/24/2021, 10:20 AM  [facility] [address] [place], [place], [address] Phone: [phone]   Fax:  [phone]  Name: Melinda Tran MRN: [id number] Date of Birth: 05/07/1951

## 2021-01-24 NOTE — Patient Instructions (Signed)
Access Code: [email or web address] URL: [email or web address] Date: 01/24/2021 Prepared by: Baldomero Lamy  Exercises Romberg Stance with Eyes Closed - 1 x daily - 7 x weekly - 1 sets - 3 reps - 30 seconds hold Romberg Stance with Head Nods - 1 x daily - 7 x weekly - 2 sets - 10 reps Romberg Stance with Head Rotation - 1 x daily - 7 x weekly - 2 sets - 10 reps Walking with Head Rotation - 1 x daily - 7 x weekly - 1 sets - 3 reps Tandem Stance with Head Rotation - 1 x daily - 7 x weekly - 2 sets - 10 reps

## 2021-01-25 DIAGNOSIS — J3089 Other allergic rhinitis: Secondary | ICD-10-CM | POA: Diagnosis not present

## 2021-01-25 DIAGNOSIS — J301 Allergic rhinitis due to pollen: Secondary | ICD-10-CM | POA: Diagnosis not present

## 2021-01-28 DIAGNOSIS — J3089 Other allergic rhinitis: Secondary | ICD-10-CM | POA: Diagnosis not present

## 2021-01-28 DIAGNOSIS — J301 Allergic rhinitis due to pollen: Secondary | ICD-10-CM | POA: Diagnosis not present

## 2021-02-09 ENCOUNTER — Other Ambulatory Visit: Payer: Self-pay

## 2021-02-09 ENCOUNTER — Ambulatory Visit: Payer: Medicare PPO | Attending: Physician Assistant

## 2021-02-09 DIAGNOSIS — R2681 Unsteadiness on feet: Secondary | ICD-10-CM | POA: Insufficient documentation

## 2021-02-09 DIAGNOSIS — R42 Dizziness and giddiness: Secondary | ICD-10-CM | POA: Insufficient documentation

## 2021-02-09 NOTE — Therapy (Signed)
[facility] [address] [place], [place], [address] Phone: [phone]   Fax:  [phone]  Physical Therapy Treatment  Patient Details  Name: Melinda Tran MRN: [id number] Date of Birth: 1951-05-04 Referring Provider (PT): Jolene Provost, [place]   Encounter Date: 02/09/2021   PT End of Session - 02/09/21 0936     Visit Number 5    Number of Visits 9    Date for PT Re-Evaluation 03/05/21    Authorization Type [employer]    PT Start Time 0935    PT Stop Time 6222    PT Time Calculation (min) 39 min    Activity Tolerance Patient tolerated treatment well    Behavior During Therapy [facility] for tasks assessed/performed             Past Medical History:  Diagnosis Date   Allergy    Anemia    in the past    Asthma    GERD (gastroesophageal reflux disease)    Hyperlipidemia     Past Surgical History:  Procedure Laterality Date   ABDOMINAL HYSTERECTOMY     COLONOSCOPY     POLYPECTOMY      There were no vitals filed for this visit.   Subjective Assessment - 02/09/21 0936     Subjective Reports that vacation went well. No dizziness reported throughout entire trip. Only issue she had was in the surf, felt like balance was challenge. Denies other new changes. Patient reports she did have a fall monday due to missing a step.    Patient is accompained by: Family member   [name] (Husband)   Pertinent History HLD, GERD, Asthma, HTN    Limitations Standing;Walking    Patient Stated Goals Resolve the Dizziness    Currently in Pain? No/denies                               [facility] Adult PT Treatment/Exercise - 02/09/21 0001       Ambulation/Gait   Ambulation/Gait Yes    Ambulation/Gait Assistance 5: Supervision    Ambulation/Gait Assistance Details completed ambulation outdoors on unlevel surfaces pavement and grass x 400 ft. no imbalance on pavement, mild unsteadiness on grass requiring  close supervision/CGA    Ambulation Distance (Feet) 400 Feet    Assistive device None    Gait Pattern Within Functional Limits    Ambulation Surface Level;Indoor;Unlevel;Outdoor;Paved;Grass      High Level Balance   High Level Balance Activities Head turns    High Level Balance Comments Completed ambulation with head turns/nods on unlevel outdoor surfaces inlcuding pavement/grass, completed 40' x 2 reps each. increased challenge on grass > pavement. CGA required with completion.      Therapeutic Activites    Therapeutic Activities Other Therapeutic Activities    Other Therapeutic Activities Completed habituation and balance training to simulate working in garden, of bending down to pick up objects from standing position  on blue mat (cones from floor), completed x 5 reps, and then second set completed with increased complaint surface for further balance challenge. Supervision throughout.             Vestibular Treatment/Exercise - 02/09/21 0001       Vestibular Treatment/Exercise   Vestibular Treatment Provided Gaze    Gaze Exercises X1 Viewing Horizontal;X1 Viewing Vertical      X1 Viewing Horizontal   Foot Position standing feet together    Reps 2  Comments x 30 seconds > then x 60 seconds, no dizziness. increased balance challenge with feet together      X1 Viewing Vertical   Foot Position standing feet together    Reps 2    Comments x 30 seconds > then x 60 seconds, no dizziness. increased balance challenge with feet together                Balance Exercises - 02/09/21 0001       Balance Exercises: Standing   Standing Eyes Opened Narrow base of support (BOS);Foam/compliant surface;Head turns;Limitations   X 10 reps of vertical/horiz head turns   Standing Eyes Closed Wide (BOA);Head turns;Foam/compliant surface;Limitations    Standing Eyes Closed Limitations completed eyes closed with slight narrow BOS 3 x 30 seconds, then with wide BOS completed horiz/verticla  head turns x 10 reps with vision removed. increased postural sway and challenge iwth vertical > horiz.            Gaze Stabilization: Standing Feet Together    Feet together, keeping eyes on target on wall 3-4 feet away, tilt head down 15-30 and move head side to side for 60 seconds. Repeat while moving head up and down for 60 seconds. Do 2-3 sessions per day.   PT Education - 02/09/21 0946     Education Details VOR Update    Person(s) Educated Patient    Methods Explanation;Demonstration;Handout    Comprehension Verbalized understanding;Returned demonstration              PT Short Term Goals - 01/24/21 1018       PT SHORT TERM GOAL #1   Title patient will undergo further vestibuar assessment (DVA/MSQ) and LTG to be set as applicable    Baseline Assessed and LTG set    Time 4    Period Weeks    Status Achieved    Target Date 02/01/21               PT Long Term Goals - 01/24/21 1019       PT LONG TERM GOAL #1   Title Patient will improve DPS > 50 (ALL LTGs Due: 03/05/21)    Baseline DPS: 39    Time 8    Period Weeks    Status New      PT LONG TERM GOAL #2   Title Patient will demonstrate (-) positional testing to indicative resolution of BPPV    Baseline (+) positional testing    Time 8    Period Weeks    Status New      PT LONG TERM GOAL #3   Title Patient will demonstrate </= 2 line difference with DVA to demo improvements with VOR    Baseline 4 Line Difference    Time 8    Period Weeks    Status Revised      PT LONG TERM GOAL #4   Title Patient will report 50% improvement in dizziness to allow for return to functional activities    Baseline unable to drive    Time 8    Period Weeks    Status New                   Plan - 02/09/21 1016     Clinical Impression Statement Continued progression of VOR x 1 to 60 seconds and narrow BOS with patient tolerating well, no dizziness reported. Continued high level balance outdoors on  unlevel surfaces, increased challenge noted on grass surfaces. Continue  to progress balance exercises indoors to complaint surfaces. Will continue to progress toward all LTGs.    Personal Factors and Comorbidities Comorbidity 2    Comorbidities HLD, GERD, Asthma, HTN    Examination-Activity Limitations Bed Mobility;Stand;Locomotion Level    Examination-Participation Restrictions Community Activity;Driving;Cleaning    Stability/Clinical Decision Making Stable/Uncomplicated    Rehab Potential Good    PT Frequency 1x / week    PT Duration 8 weeks    PT Treatment/Interventions Canalith Repostioning;Cryotherapy;Electrical Stimulation;Moist Heat;DME Instruction;Gait training;Stair training;Functional mobility training;Therapeutic activities;Therapeutic exercise;Balance training;Neuromuscular re-education;Patient/family education;Dry needling;Passive range of motion;Manual techniques;Vestibular    PT Next Visit Plan How was VOR update? continue to progress habituation/VOR x1. gait with head turns. high level balance. corner balance (progress to complaint surface). Balance on outdoor surfaces. Add in eyes closed + head turns to HEP    Consulted and Agree with Plan of Care Patient             Patient will benefit from skilled therapeutic intervention in order to improve the following deficits and impairments:  Decreased balance, Dizziness, Decreased activity tolerance  Visit Diagnosis: Unsteadiness on feet  Dizziness and giddiness     Problem List Patient Active Problem List   Diagnosis Date Noted   Benign positional vertigo, right 12/14/2020   RSV bronchitis 04/14/2020   Blood glucose elevated 11/19/2019   Colon polyps 11/19/2019   Chronic cough 07/23/2019   Asthma exacerbation 06/20/2019   Hordeolum externum of right upper eyelid 10/14/2018   Estrogen deficiency 04/29/2018   Asthmatic bronchitis , chronic ([place]) 04/29/2018   GERD (gastroesophageal reflux disease) 05/27/2015   HLD  (hyperlipidemia) 06/11/2014   Allergic asthma 04/16/2014   Family history of colon cancer in mother 04/16/2014   Disorder of bone and cartilage 07/10/2007    Jones Bales, PT, DPT 02/09/2021, 10:18 AM  [facility] [address] [place], [place], [address] Phone: [phone]   Fax:  [phone]  Name: Melinda Tran MRN: [id number] Date of Birth: 02/02/1951

## 2021-02-09 NOTE — Patient Instructions (Signed)
Gaze Stabilization: Standing Feet Together    Feet together, keeping eyes on target on wall 3-4 feet away, tilt head down 15-30 and move head side to side for 60 seconds. Repeat while moving head up and down for 60 seconds. Do 2-3 sessions per day.  Copyright  VHI. All rights reserved.

## 2021-02-10 DIAGNOSIS — J301 Allergic rhinitis due to pollen: Secondary | ICD-10-CM | POA: Diagnosis not present

## 2021-02-10 DIAGNOSIS — J3089 Other allergic rhinitis: Secondary | ICD-10-CM | POA: Diagnosis not present

## 2021-02-14 ENCOUNTER — Telehealth: Payer: Medicare PPO | Admitting: Family Medicine

## 2021-02-14 ENCOUNTER — Encounter: Payer: Self-pay | Admitting: Family Medicine

## 2021-02-14 VITALS — Wt 158.0 lb

## 2021-02-14 DIAGNOSIS — H8111 Benign paroxysmal vertigo, right ear: Secondary | ICD-10-CM

## 2021-02-14 DIAGNOSIS — J4541 Moderate persistent asthma with (acute) exacerbation: Secondary | ICD-10-CM | POA: Diagnosis not present

## 2021-02-14 DIAGNOSIS — J069 Acute upper respiratory infection, unspecified: Secondary | ICD-10-CM

## 2021-02-14 MED ORDER — PREDNISONE 20 MG PO TABS: 40.0000 mg | ORAL_TABLET | Freq: Every day | ORAL | 0 refills | Status: AC

## 2021-02-14 NOTE — Assessment & Plan Note (Signed)
Discussed given duration of ear pain low suspicion for ear infection given other symptoms are newer and reviewed my visit in early may and ENT visit with TM clear both time. Cont PT for vertigo. F/u prn if not improved

## 2021-02-14 NOTE — Progress Notes (Signed)
I connected with [name] on 02/14/21 at 12:20 PM EDT by video and verified that I am speaking with the correct person using two identifiers.   I discussed the limitations, risks, security and privacy concerns of performing an evaluation and management service by video and the availability of in person appointments. I also discussed with the patient that there may be a patient responsible charge related to this service. The patient expressed understanding and agreed to proceed.  Patient location: Home Provider Location: [facility] Participants: Lesleigh Noe and [name]   Subjective:     Melinda Tran is a 70 y.o. female presenting for Eye Drainage (R), Cough (Productive with green mucous. Hurts in chest), and Ear Pain (R)     Cough This is a new problem. Episode onset: 02/11/2021. The cough is Productive of purulent sputum. Associated symptoms include chest pain, ear pain, headaches, nasal congestion, postnasal drip and wheezing. Pertinent negatives include no fever, myalgias, sore throat or shortness of breath. Associated symptoms comments: Vertigo,eye drainage. The symptoms are aggravated by lying down. She has tried OTC cough suppressant for the symptoms. The treatment provided no relief.   No n/v No sick contact  No loss of taste or smell  Did start with vertigo on 12/14/2020 - saw a PA for vertigo on 12/31/2020 and referred to PT Has been going to PT for treatment of vertigo But her head always feels like she has pressure Also with ear ache When having vertigo could not hear out of the right ear  Review of Systems  Constitutional:  Negative for fever.  HENT:  Positive for ear pain and postnasal drip. Negative for sore throat.   Eyes:  Positive for discharge. Negative for itching.  Respiratory:  Positive for cough and wheezing. Negative for shortness of breath.   Cardiovascular:  Positive for chest pain.  Gastrointestinal:  Negative for abdominal pain,  diarrhea, nausea and vomiting.  Musculoskeletal:  Negative for myalgias.  Neurological:  Positive for headaches.    Social History   Tobacco Use  Smoking Status Former   Packs/day: 0.25   Years: 5.00   Pack years: 1.25   Types: Cigarettes   Quit date: 1977   Years since quitting: 45.5  Smokeless Tobacco Never        Objective:   BP Readings from Last 3 Encounters:  12/14/20 130/78  11/19/19 132/74  10/14/18 130/76   Wt Readings from Last 3 Encounters:  02/14/21 158 lb (71.7 kg)  12/14/20 155 lb (70.3 kg)  07/27/20 155 lb (70.3 kg)    Wt 158 lb (71.7 kg)   BMI 26.50 kg/m   Physical Exam Constitutional:      Appearance: Normal appearance. She is not ill-appearing.  HENT:     Head: Normocephalic and atraumatic.     Right Ear: External ear normal.     Left Ear: External ear normal.  Eyes:     Conjunctiva/sclera: Conjunctivae normal.  Pulmonary:     Effort: Pulmonary effort is normal. No respiratory distress.  Neurological:     Mental Status: She is alert. Mental status is at baseline.  Psychiatric:        Mood and Affect: Mood normal.        Behavior: Behavior normal.        Thought Content: Thought content normal.        Judgment: Judgment normal.           Assessment & Plan:  Problem List Items Addressed This Visit       Respiratory   Asthma exacerbation - Primary    Covid test at home was negative. Suspect asthma exacerbation and will treat with prednisone x 4 days. Call if worsening.        Relevant Medications   Fluticasone-Umeclidin-Vilant (TRELEGY ELLIPTA IN)   predniSONE (DELTASONE) 20 MG tablet     Nervous and Auditory   Benign positional vertigo, right    Discussed given duration of ear pain low suspicion for ear infection given other symptoms are newer and reviewed my visit in early may and ENT visit with TM clear both time. Cont PT for vertigo. F/u prn if not improved         Return if symptoms worsen or fail to  improve.  Lesleigh Noe, MD

## 2021-02-14 NOTE — Assessment & Plan Note (Signed)
Covid test at home was negative. Suspect asthma exacerbation and will treat with prednisone x 4 days. Call if worsening.

## 2021-02-16 ENCOUNTER — Ambulatory Visit: Payer: Medicare PPO

## 2021-02-16 ENCOUNTER — Other Ambulatory Visit: Payer: Self-pay

## 2021-02-16 DIAGNOSIS — R2681 Unsteadiness on feet: Secondary | ICD-10-CM | POA: Diagnosis not present

## 2021-02-16 DIAGNOSIS — R42 Dizziness and giddiness: Secondary | ICD-10-CM | POA: Diagnosis not present

## 2021-02-16 MED ORDER — PROMETHAZINE-DM 6.25-15 MG/5ML PO SYRP: 5.0000 mL | ORAL_SOLUTION | Freq: Four times a day (QID) | ORAL | 0 refills | Status: DC | PRN

## 2021-02-16 NOTE — Therapy (Signed)
[facility] [address] [place], [place], [address] Phone: [phone]   Fax:  [phone]  Physical Therapy Treatment  Patient Details  Name: Melinda Tran MRN: [id number] Date of Birth: [birth year] Referring Provider (PT): Jolene Provost, [place]   Encounter Date: 02/16/2021   PT End of Session - 02/16/21 0936     Visit Number 6    Number of Visits 9    Date for PT Re-Evaluation 03/05/21    Authorization Type [employer]    PT Start Time 0932    PT Stop Time 1012    PT Time Calculation (min) 40 min    Activity Tolerance Patient tolerated treatment well    Behavior During Therapy [facility] for tasks assessed/performed             Past Medical History:  Diagnosis Date   Allergy    Anemia    in the past    Asthma    GERD (gastroesophageal reflux disease)    Hyperlipidemia     Past Surgical History:  Procedure Laterality Date   ABDOMINAL HYSTERECTOMY     COLONOSCOPY     POLYPECTOMY      There were no vitals filed for this visit.   Subjective Assessment - 02/16/21 0935     Subjective Patient reports that last friday she had some congestion, ear pain. Saw MD virtually and was prescribed predisone. Patient reports difficulty sleeping due to this. No falls.    Patient is accompained by: Family member   [name] (Husband)   Pertinent History HLD, GERD, Asthma, HTN    Limitations Standing;Walking    Patient Stated Goals Resolve the Dizziness    Currently in Pain? No/denies                               [facility] Adult PT Treatment/Exercise - 02/16/21 0001       Ambulation/Gait   Ambulation/Gait Yes    Ambulation/Gait Assistance 5: Supervision    Ambulation/Gait Assistance Details with high level balance    Ambulation Distance (Feet) --   clinic distance   Assistive device None    Gait Pattern Within Functional Limits    Ambulation Surface Level;Indoor      High Level Balance    High Level Balance Activities Turns;Sudden stops;Head turns;Direction changes;Backward walking    High Level Balance Comments Completed ambulation with addition of head turns/nods, direction changes, sudden stops, backwards walking, and changes in gait speed. Supervision throughout,no imbalance noted. Completed ambulation with addition otasks x 500 ft in therapy gym.                 Balance Exercises - 02/16/21 0001       Balance Exercises: Standing   Standing Eyes Closed Wide (BOA);Foam/compliant surface;3 reps;30 secs   EC on airex 3 x 30 seconds:   Tandem Stance Eyes open;1 rep;30 secs    Tandem Stance Time completed eyes open x 30 seconds, then progressed to addition of horizontal head turns x 10 reps alternating foot position with EO.    SLS with Vectors Foam/compliant surface;Intermittent upper extremity assist;Limitations    SLS with Vectors Limitations standing on airex completing alternating toe taps to cones x 10 reps crossover, no UE support utilized. CGA.    Tandem Gait Forward;Intermittent upper extremity support;Limitations    Tandem Gait Limitations completed x 2 laps down and back, no UE support, one instance of mild  balance challenge overall demo improvements    Sidestepping Foam/compliant support;Limitations    Sidestepping Limitations completed side stepping on blue balance beam x 3 laps down and back no UE support, cues for step length and control with completion.    Marching Foam/compliant surface;Head turns;Forwards;Static;Limitations    Marching Limitations completed forward marching on blue mat x 2 laps down and back; then completed static marching on blue mat x 30 seconds with EO, then progressed to addition of horizontal head turns 2 x 30 seconds; increased challenge with addition of head turns with marching, CGA. Intermittent UE support.                 PT Short Term Goals - 01/24/21 1018       PT SHORT TERM GOAL #1   Title patient will undergo  further vestibuar assessment (DVA/MSQ) and LTG to be set as applicable    Baseline Assessed and LTG set    Time 4    Period Weeks    Status Achieved    Target Date 02/01/21               PT Long Term Goals - 01/24/21 1019       PT LONG TERM GOAL #1   Title Patient will improve DPS > 50 (ALL LTGs Due: 03/05/21)    Baseline DPS: 39    Time 8    Period Weeks    Status New      PT LONG TERM GOAL #2   Title Patient will demonstrate (-) positional testing to indicative resolution of BPPV    Baseline (+) positional testing    Time 8    Period Weeks    Status New      PT LONG TERM GOAL #3   Title Patient will demonstrate </= 2 line difference with DVA to demo improvements with VOR    Baseline 4 Line Difference    Time 8    Period Weeks    Status Revised      PT LONG TERM GOAL #4   Title Patient will report 50% improvement in dizziness to allow for return to functional activities    Baseline unable to drive    Time 8    Period Weeks    Status New                   Plan - 02/16/21 1014     Clinical Impression Statement Patient continues to report no dizziness, therefore continued focus on balance activities. Completed high level balance with ambulation, as well as continued focuse on balance asctivities with complaint surface and vision removed. Patient continue to demo improvements in balance and no reoccurence of dizziness. Patient agreeable to d/c at next visit.    Personal Factors and Comorbidities Comorbidity 2    Comorbidities HLD, GERD, Asthma, HTN    Examination-Activity Limitations Bed Mobility;Stand;Locomotion Level    Examination-Participation Restrictions Community Activity;Driving;Cleaning    Stability/Clinical Decision Making Stable/Uncomplicated    Rehab Potential Good    PT Frequency 1x / week    PT Duration 8 weeks    PT Treatment/Interventions Canalith Repostioning;Cryotherapy;Electrical Stimulation;Moist Heat;DME Instruction;Gait  training;Stair training;Functional mobility training;Therapeutic activities;Therapeutic exercise;Balance training;Neuromuscular re-education;Patient/family education;Dry needling;Passive range of motion;Manual techniques;Vestibular    PT Next Visit Plan Check LTG + D/C. Update exercise program. Teach Home [place] for R Ear.    Consulted and Agree with Plan of Care Patient  Patient will benefit from skilled therapeutic intervention in order to improve the following deficits and impairments:  Decreased balance, Dizziness, Decreased activity tolerance  Visit Diagnosis: Unsteadiness on feet  Dizziness and giddiness     Problem List Patient Active Problem List   Diagnosis Date Noted   Benign positional vertigo, right 12/14/2020   RSV bronchitis 04/14/2020   Blood glucose elevated 11/19/2019   Colon polyps 11/19/2019   Chronic cough 07/23/2019   Asthma exacerbation 06/20/2019   Hordeolum externum of right upper eyelid 10/14/2018   Estrogen deficiency 04/29/2018   Asthmatic bronchitis , chronic ([place]) 04/29/2018   GERD (gastroesophageal reflux disease) 05/27/2015   HLD (hyperlipidemia) 06/11/2014   Allergic asthma 04/16/2014   Family history of colon cancer in mother 04/16/2014   Disorder of bone and cartilage 07/10/2007    Jones Bales, PT, DPT 02/16/2021, 10:59 AM  [place] [place] [address] [place], [place], [address] Phone: [phone]   Fax:  [phone]  Name: BETTYLOU FREW MRN: [id number] Date of Birth: [birth year]

## 2021-02-23 ENCOUNTER — Ambulatory Visit: Payer: Medicare PPO

## 2021-02-24 ENCOUNTER — Encounter: Payer: Self-pay | Admitting: Family Medicine

## 2021-02-25 DIAGNOSIS — J3089 Other allergic rhinitis: Secondary | ICD-10-CM | POA: Diagnosis not present

## 2021-02-25 DIAGNOSIS — J301 Allergic rhinitis due to pollen: Secondary | ICD-10-CM | POA: Diagnosis not present

## 2021-03-04 DIAGNOSIS — J3089 Other allergic rhinitis: Secondary | ICD-10-CM | POA: Diagnosis not present

## 2021-03-04 DIAGNOSIS — J301 Allergic rhinitis due to pollen: Secondary | ICD-10-CM | POA: Diagnosis not present

## 2021-03-07 ENCOUNTER — Ambulatory Visit: Payer: Medicare PPO | Attending: Physician Assistant

## 2021-03-07 ENCOUNTER — Other Ambulatory Visit: Payer: Self-pay

## 2021-03-07 DIAGNOSIS — R2681 Unsteadiness on feet: Secondary | ICD-10-CM | POA: Diagnosis not present

## 2021-03-07 DIAGNOSIS — R42 Dizziness and giddiness: Secondary | ICD-10-CM | POA: Insufficient documentation

## 2021-03-07 NOTE — Therapy (Signed)
[facility] [address] [place], [place], [address] Phone: [phone]   Fax:  [phone]  Physical Therapy Treatment/Re-Certification/Discharge Summary  Patient Details  Name: Melinda Tran MRN: [id number] Date of Birth: 1951/08/03 Referring Provider (PT): Jolene Provost, PA-C  PHYSICAL THERAPY DISCHARGE SUMMARY  Visits from Start of Care: 7  Current functional level related to goals / functional outcomes: See Clinical Impression Statement   Remaining deficits: None   Education / Equipment: HEP Provided   Patient agrees to discharge. Patient goals were met. Patient is being discharged due to meeting the stated rehab goals.   Encounter Date: 03/07/2021   PT End of Session - 03/07/21 1450     Visit Number 7    Number of Visits 9    Date for PT Re-Evaluation 03/08/21    Authorization Type [employer]    PT Start Time 2876    PT Stop Time 1524    PT Time Calculation (min) 37 min    Activity Tolerance Patient tolerated treatment well    Behavior During Therapy WFL for tasks assessed/performed             Past Medical History:  Diagnosis Date   Allergy    Anemia    in the past    Asthma    GERD (gastroesophageal reflux disease)    Hyperlipidemia     Past Surgical History:  Procedure Laterality Date   ABDOMINAL HYSTERECTOMY     COLONOSCOPY     POLYPECTOMY      There were no vitals filed for this visit.   Subjective Assessment - 03/07/21 1452     Subjective Patient reports that the congestion has cleared. No episodes of dizziness. No other new changes.    Patient is accompained by: Family member   [name] (Husband)   Pertinent History HLD, GERD, Asthma, HTN    Limitations Standing;Walking    Patient Stated Goals Resolve the Dizziness    Currently in Pain? No/denies                [facility] PT Assessment - 03/07/21 0001       Assessment   Medical Diagnosis Vertigo    Referring  Provider (PT) Jolene Provost, PA-C      Observation/Other Assessments   Focus on Therapeutic Outcomes (FOTO)  DPS: 61, DFS: 67.4               Vestibular Assessment - 03/07/21 0001       Visual Acuity   Static 10    Dynamic 9      Positional Testing   Dix-Hallpike Dix-Hallpike Right;Dix-Hallpike Left    Sidelying Test Sidelying Right;Sidelying Left      Dix-Hallpike Right   Dix-Hallpike Right Duration 0    Dix-Hallpike Right Symptoms No nystagmus      Dix-Hallpike Left   Dix-Hallpike Left Duration 0    Dix-Hallpike Left Symptoms No nystagmus      Horizontal Canal Right   Horizontal Canal Right Duration 0    Horizontal Canal Right Symptoms Normal      Horizontal Canal Left   Horizontal Canal Left Duration 0    Horizontal Canal Left Symptoms Normal               Completed entire review of the HEP and progressed to patient's tolerance. Bold are progressions in today's session.   Access Code: [email or web address] URL: [email or web address] Date: 03/07/2021 Prepared by: Baldomero Lamy   Exercises Standing Balance with  Eyes Closed on Foam - 1 x daily - 7 x weekly - 1 sets - 3 reps - 30 seconds hold Romberg Stance on Foam Pad with Head Rotation - 1 x daily - 7 x weekly - 2 sets - 10 reps Romberg Stance with Head Nods on Foam Pad - 1 x daily - 7 x weekly - 2 sets - 10 reps Tandem Stance with Head Rotation - 1 x daily - 7 x weekly - 2 sets - 10 reps Walking with Head Rotation - 1 x daily - 7 x weekly - 1 sets - 3 reps Tandem Walking with Counter Support - 1 x daily - 7 x weekly - 1 sets - 3-4 reps   Patient Education Right Epley Maneuver      [facility] Adult PT Treatment/Exercise - 03/07/21 0001       Ambulation/Gait   Ambulation/Gait Yes    Ambulation/Gait Assistance 7: Independent    Assistive device None    Gait Pattern Within Functional Limits    Ambulation Surface Level;Indoor      Therapeutic Activites    Therapeutic Activities Other  Therapeutic Activities    Other Therapeutic Activities PT educating patient on self managment of dizziness/BPPV in case of reoccurence. Educated on [employer] and handout provided.              PT Education - 03/07/21 1524     Education Details Progress toward LTG; Updated HEP; R Epley    Person(s) Educated Patient    Methods Explanation;Demonstration;Handout    Comprehension Verbalized understanding;Returned demonstration              PT Short Term Goals - 01/24/21 1018       PT SHORT TERM GOAL #1   Title patient will undergo further vestibuar assessment (DVA/MSQ) and LTG to be set as applicable    Baseline Assessed and LTG set    Time 4    Period Weeks    Status Achieved    Target Date 02/01/21               PT Long Term Goals - 03/07/21 1454       PT LONG TERM GOAL #1   Title Patient will improve DPS > 50 (ALL LTGs Due: 03/05/21)    Baseline DPS: 39; DPS: 61, DFS: 67.4    Time 8    Period Weeks    Status Achieved      PT LONG TERM GOAL #2   Title Patient will demonstrate (-) positional testing to indicative resolution of BPPV    Baseline (+) positional testing; (-) positional testing    Time 8    Period Weeks    Status Achieved      PT LONG TERM GOAL #3   Title Patient will demonstrate </= 2 line difference with DVA to demo improvements with VOR    Baseline 4 Line Difference; 1 Line Difference    Time 8    Period Weeks    Status Achieved      PT LONG TERM GOAL #4   Title Patient will report 50% improvement in dizziness to allow for return to functional activities    Baseline unable to drive; has returned to drive 39% improvement per patient reports.    Time 8    Period Weeks    Status Achieved                   Plan - 03/07/21 1528  Clinical Impression Statement Today's skilled PT session included assesment of patient's progress toward LTG. Patient able to meet all LTG today during session indicating resolution of BPPV and improved  VOR funciton with 1 line difference on DVA. Patient demonstating significant improvements with PT services including resolution of BPPV and improved balance. PT educating on R Epley and updated HEP for d/c. Patient dmeo readiness for d/c at this time, with patient verbalize agreement.    Personal Factors and Comorbidities Comorbidity 2    Comorbidities HLD, GERD, Asthma, HTN    Examination-Activity Limitations Bed Mobility;Stand;Locomotion Level    Examination-Participation Restrictions Community Activity;Driving;Cleaning    Stability/Clinical Decision Making Stable/Uncomplicated    Rehab Potential Good    PT Frequency 1x / week    PT Duration 1 weeks    PT Treatment/Interventions Canalith Repostioning;Cryotherapy;Electrical Stimulation;Moist Heat;DME Instruction;Gait training;Stair training;Functional mobility training;Therapeutic activities;Therapeutic exercise;Balance training;Neuromuscular re-education;Patient/family education;Dry needling;Passive range of motion;Manual techniques;Vestibular    Consulted and Agree with Plan of Care Patient             Patient will benefit from skilled therapeutic intervention in order to improve the following deficits and impairments:  Decreased balance, Dizziness, Decreased activity tolerance  Visit Diagnosis: Unsteadiness on feet  Dizziness and giddiness     Problem List Patient Active Problem List   Diagnosis Date Noted   Benign positional vertigo, right 12/14/2020   RSV bronchitis 04/14/2020   Blood glucose elevated 11/19/2019   Colon polyps 11/19/2019   Chronic cough 07/23/2019   Asthma exacerbation 06/20/2019   Hordeolum externum of right upper eyelid 10/14/2018   Estrogen deficiency 04/29/2018   Asthmatic bronchitis , chronic ([place]) 04/29/2018   GERD (gastroesophageal reflux disease) 05/27/2015   HLD (hyperlipidemia) 06/11/2014   Allergic asthma 04/16/2014   Family history of colon cancer in mother 04/16/2014   Disorder of bone  and cartilage 07/10/2007    Jones Bales, PT, DPT 03/07/2021, 3:30 PM  [facility] [address] [place], [place], [address] Phone: [phone]   Fax:  [phone]  Name: Melinda Tran MRN: [id number] Date of Birth: Nov 11, 1950

## 2021-03-07 NOTE — Patient Instructions (Signed)
Access Code: [email or web address] URL: [email or web address] Date: 03/07/2021 Prepared by: Baldomero Lamy  Exercises Standing Balance with Eyes Closed on Foam - 1 x daily - 7 x weekly - 1 sets - 3 reps - 30 seconds hold Romberg Stance on Foam Pad with Head Rotation - 1 x daily - 7 x weekly - 2 sets - 10 reps Romberg Stance with Head Nods on Foam Pad - 1 x daily - 7 x weekly - 2 sets - 10 reps Tandem Stance with Head Rotation - 1 x daily - 7 x weekly - 2 sets - 10 reps Walking with Head Rotation - 1 x daily - 7 x weekly - 1 sets - 3 reps Tandem Walking with Counter Support - 1 x daily - 7 x weekly - 1 sets - 3-4 reps  Patient Education Right Epley Maneuver

## 2021-03-11 DIAGNOSIS — J3089 Other allergic rhinitis: Secondary | ICD-10-CM | POA: Diagnosis not present

## 2021-03-11 DIAGNOSIS — J301 Allergic rhinitis due to pollen: Secondary | ICD-10-CM | POA: Diagnosis not present

## 2021-03-18 DIAGNOSIS — J3089 Other allergic rhinitis: Secondary | ICD-10-CM | POA: Diagnosis not present

## 2021-03-18 DIAGNOSIS — J301 Allergic rhinitis due to pollen: Secondary | ICD-10-CM | POA: Diagnosis not present

## 2021-03-25 DIAGNOSIS — J301 Allergic rhinitis due to pollen: Secondary | ICD-10-CM | POA: Diagnosis not present

## 2021-03-25 DIAGNOSIS — J3089 Other allergic rhinitis: Secondary | ICD-10-CM | POA: Diagnosis not present

## 2021-04-01 DIAGNOSIS — J301 Allergic rhinitis due to pollen: Secondary | ICD-10-CM | POA: Diagnosis not present

## 2021-04-01 DIAGNOSIS — J3089 Other allergic rhinitis: Secondary | ICD-10-CM | POA: Diagnosis not present

## 2021-04-04 ENCOUNTER — Other Ambulatory Visit: Payer: Self-pay | Admitting: Family Medicine

## 2021-04-04 DIAGNOSIS — E785 Hyperlipidemia, unspecified: Secondary | ICD-10-CM

## 2021-04-06 NOTE — Telephone Encounter (Signed)
Pt is scheduled for 9/14

## 2021-04-06 NOTE — Telephone Encounter (Signed)
Pt needs an appt. No refill provided until appt is scheduled.

## 2021-04-07 DIAGNOSIS — J3089 Other allergic rhinitis: Secondary | ICD-10-CM | POA: Diagnosis not present

## 2021-04-07 DIAGNOSIS — J301 Allergic rhinitis due to pollen: Secondary | ICD-10-CM | POA: Diagnosis not present

## 2021-04-14 DIAGNOSIS — J301 Allergic rhinitis due to pollen: Secondary | ICD-10-CM | POA: Diagnosis not present

## 2021-04-14 DIAGNOSIS — J3089 Other allergic rhinitis: Secondary | ICD-10-CM | POA: Diagnosis not present

## 2021-04-20 ENCOUNTER — Other Ambulatory Visit: Payer: Self-pay

## 2021-04-20 ENCOUNTER — Ambulatory Visit (INDEPENDENT_AMBULATORY_CARE_PROVIDER_SITE_OTHER): Payer: Medicare PPO | Admitting: Family Medicine

## 2021-04-20 ENCOUNTER — Encounter: Payer: Self-pay | Admitting: Family Medicine

## 2021-04-20 VITALS — BP 148/80 | HR 65 | Temp 97.0°F | Wt 167.1 lb

## 2021-04-20 DIAGNOSIS — Z1159 Encounter for screening for other viral diseases: Secondary | ICD-10-CM

## 2021-04-20 DIAGNOSIS — J4530 Mild persistent asthma, uncomplicated: Secondary | ICD-10-CM

## 2021-04-20 DIAGNOSIS — E2839 Other primary ovarian failure: Secondary | ICD-10-CM | POA: Diagnosis not present

## 2021-04-20 DIAGNOSIS — E785 Hyperlipidemia, unspecified: Secondary | ICD-10-CM | POA: Diagnosis not present

## 2021-04-20 DIAGNOSIS — Z0000 Encounter for general adult medical examination without abnormal findings: Secondary | ICD-10-CM

## 2021-04-20 LAB — CBC WITH DIFFERENTIAL/PLATELET
Basophils Absolute: 0.1 10*3/uL (ref 0.0–0.1)
Basophils Relative: 1.1 % (ref 0.0–3.0)
Eosinophils Absolute: 0.2 10*3/uL (ref 0.0–0.7)
Eosinophils Relative: 4.7 % (ref 0.0–5.0)
HCT: 38.7 % (ref 36.0–46.0)
Hemoglobin: 12.9 g/dL (ref 12.0–15.0)
Lymphocytes Relative: 29.9 % (ref 12.0–46.0)
Lymphs Abs: 1.5 10*3/uL (ref 0.7–4.0)
MCHC: 33.3 g/dL (ref 30.0–36.0)
MCV: 93.5 fl (ref 78.0–100.0)
Monocytes Absolute: 0.4 10*3/uL (ref 0.1–1.0)
Monocytes Relative: 8.2 % (ref 3.0–12.0)
Neutro Abs: 2.8 10*3/uL (ref 1.4–7.7)
Neutrophils Relative %: 56.1 % (ref 43.0–77.0)
Platelets: 266 10*3/uL (ref 150.0–400.0)
RBC: 4.13 Mil/uL (ref 3.87–5.11)
RDW: 12.9 % (ref 11.5–15.5)
WBC: 5 10*3/uL (ref 4.0–10.5)

## 2021-04-20 LAB — LIPID PANEL
Cholesterol: 183 mg/dL (ref 0–200)
HDL: 63.8 mg/dL (ref >39.00)
LDL Cholesterol: 100 mg/dL — ABNORMAL HIGH (ref 0–99)
NonHDL: 119.54
Total CHOL/HDL Ratio: 3
Triglycerides: 98 mg/dL (ref 0.0–149.0)
VLDL: 19.6 mg/dL (ref 0.0–40.0)

## 2021-04-20 LAB — COMPREHENSIVE METABOLIC PANEL
ALT: 16 U/L (ref 0–35)
AST: 11 U/L (ref 0–37)
Albumin: 4.3 g/dL (ref 3.5–5.2)
Alkaline Phosphatase: 85 U/L (ref 39–117)
BUN: 11 mg/dL (ref 6–23)
CO2: 28 mEq/L (ref 19–32)
Calcium: 9.6 mg/dL (ref 8.4–10.5)
Chloride: 104 mEq/L (ref 96–112)
Creatinine, Ser: 0.66 mg/dL (ref 0.40–1.20)
GFR: 89.13 mL/min (ref >60.00)
Glucose, Bld: 105 mg/dL — ABNORMAL HIGH (ref 70–99)
Potassium: 3.9 mEq/L (ref 3.5–5.1)
Sodium: 141 mEq/L (ref 135–145)
Total Bilirubin: 0.6 mg/dL (ref 0.2–1.2)
Total Protein: 6.7 g/dL (ref 6.0–8.3)

## 2021-04-20 MED ORDER — ATORVASTATIN CALCIUM 20 MG PO TABS: 20.0000 mg | ORAL_TABLET | Freq: Every day | ORAL | 3 refills | Status: DC

## 2021-04-20 NOTE — Patient Instructions (Addendum)
Would encourage you to get Tdap and Shingles at the pharmacy  You can call for a mammogram at these locations - can also schedule DEXA on the same day  [place] [phone]  Your blood pressure high.   High blood pressure increases your risk for heart attack and stroke.    Please check your blood pressure 2-4 times a week.   To check your blood pressure 1) Sit in a quiet and relaxed place for 5 minutes 2) Make sure your feet are flat on the ground 3) Consider checking first thing in the morning   Normal blood pressure is less than 140/90 Ideally you blood pressure should be around 120/80  Other ways you can reduce your blood pressure:  1) Regular exercise -- Try to get 150 minutes (30 minutes, 5 days a week) of moderate to vigorous aerobic excercise -- Examples: brisk walking (2.5 miles per hour), water aerobics, dancing, gardening, tennis, biking slower than 10 miles per hour 2) DASH Diet - low fat meats, more fresh fruits and vegetables, whole grains, low salt 3) Quit smoking if you smoke 4) Loose 5-10% of your body weight

## 2021-04-20 NOTE — Progress Notes (Signed)
Subjective:   Melinda Tran is a 70 y.o. female who presents for Medicare Annual (Subsequent) preventive examination.  Review of Systems    Review of Systems  Constitutional:  Negative for chills and fever.  HENT:  Negative for congestion and sore throat.   Eyes:  Negative for blurred vision and double vision.  Respiratory:  Negative for shortness of breath.   Cardiovascular:  Negative for chest pain.  Gastrointestinal:  Negative for heartburn, nausea and vomiting.  Genitourinary: Negative.   Musculoskeletal: Negative.  Negative for myalgias.  Skin:  Negative for rash.  Neurological:  Positive for dizziness (vertigo). Negative for headaches.  Endo/Heme/Allergies:  Does not bruise/bleed easily.  Psychiatric/Behavioral:  Negative for depression. The patient is not nervous/anxious.          Objective:    Today's Vitals   04/20/21 0851 04/20/21 0922  BP: (!) 150/84 (!) 148/80  Pulse: 65   Temp: (!) 97 F (36.1 C)   TempSrc: Temporal   SpO2: 98%   Weight: 167 lb 2 oz (75.8 kg)    Body mass index is 28.03 kg/m.  Advanced Directives 04/20/2021 01/04/2021 07/09/2019 09/09/2014  Does Patient Have a Medical Advance Directive? Yes Yes Yes Yes  Type of Advance Directive Living will [place];Living will [place];Living will [place];Living will  Does patient want to make changes to medical advance directive? No - Patient declined No - Patient declined No - Patient declined -  Copy of [place] in Chart? - - Yes - validated most recent copy scanned in chart (See row information) -    Current Medications (verified) Outpatient Encounter Medications as of 04/20/2021  Medication Sig   calcium carbonate 200 MG capsule Take 250 mg by mouth daily.   cetirizine (ZYRTEC) 10 MG tablet Take 10 mg by mouth daily.   cholecalciferol (VITAMIN D) 1000 UNITS tablet Take 1,000 Units by mouth daily.   EPIPEN 2-PAK 0.3  MG/0.3ML SOAJ injection See admin instructions.   esomeprazole (NEXIUM) 40 MG capsule Take 40 mg by mouth daily at 12 noon.   fluticasone (FLONASE) 50 MCG/ACT nasal spray 1-2 sprays   fluticasone-salmeterol (ADVAIR HFA) 230-21 MCG/ACT inhaler Inhale 2 puffs into the lungs 2 (two) times daily.   Glucosamine-Chondroitin (GLUCOSAMINE CHONDR COMPLEX PO) Take 2 tablets by mouth daily.   montelukast (SINGULAIR) 10 MG tablet Take 10 mg by mouth at bedtime.   Omega-3 1000 MG CAPS Take 2 capsules by mouth daily.   PROAIR RESPICLICK 123XX123 (90 Base) MCG/ACT AEPB Inhale 2 puffs into the lungs 4 (four) times daily as needed (dyspnea, cough, wheeze).   Spacer/Aero-Holding Chambers (AEROCHAMBER PLUS FLO-VU MEDIUM) MISC See admin instructions.   Turmeric 500 MG CAPS Take by mouth daily.   vitamin C (ASCORBIC ACID) 500 MG tablet Take 500 mg by mouth daily.   [DISCONTINUED] atorvastatin (LIPITOR) 20 MG tablet Take 1 tablet (20 mg total) by mouth daily. Needs appt with PCP for further refills.   atorvastatin (LIPITOR) 20 MG tablet Take 1 tablet (20 mg total) by mouth daily.   [DISCONTINUED] Fluticasone-Umeclidin-Vilant (TRELEGY ELLIPTA IN) Inhale 1 puff into the lungs in the morning.   [DISCONTINUED] promethazine-dextromethorphan (PROMETHAZINE-DM) 6.25-15 MG/5ML syrup Take 5 mLs by mouth 4 (four) times daily as needed for cough.   No facility-administered encounter medications on file as of 04/20/2021.    Allergies (verified) Patient has no known allergies.   History: Past Medical History:  Diagnosis Date  Allergy    Anemia    in the past    Asthma    GERD (gastroesophageal reflux disease)    Hyperlipidemia    Past Surgical History:  Procedure Laterality Date   ABDOMINAL HYSTERECTOMY     COLONOSCOPY     POLYPECTOMY     Family History  Problem Relation Age of Onset   Colon cancer Mother    Lung cancer Father    Prostate cancer Father    Alcohol abuse Father    Drug abuse Brother        in  recovery   Alcohol abuse Brother        in recovery   Testicular cancer Son    Stroke Maternal Grandmother    Heart attack Maternal Grandfather    Rectal cancer Neg Hx    Stomach cancer Neg Hx    Social History   Socioeconomic History   Marital status: Married    Spouse name: [name]   Number of children: 3   Years of education: College   Highest education level: Not on file  Occupational History   Not on file  Tobacco Use   Smoking status: Former    Packs/day: 0.25    Years: 5.00    Pack years: 1.25    Types: Cigarettes    Quit date: 1977    Years since quitting: 45.7   Smokeless tobacco: Never  Vaping Use   Vaping Use: Never used  Substance and Sexual Activity   Alcohol use: Yes    Alcohol/week: 7.0 standard drinks    Types: 7 Standard drinks or equivalent per week    Comment: 1 glass of wine a day    Drug use: No   Sexual activity: Yes    Birth control/protection: Surgical  Other Topics Concern   Not on file  Social History Narrative   11/19/19   From: the area   Living: with [name] [phone])   Work: retired - Education officer, 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      Family: 3 children in [place], 8 grandchildren       Enjoys: garden, keep grandchildren, walk 2+ miles per day      Exercise: walking 2+ miles per day   Diet: weight watchers with husband, lots of veggies - Programmer, applications belts: Yes    Guns: No   Safe in relationships: Yes    Social Determinants of Radio broadcast assistant Strain: Not on file  Food Insecurity: Not on file  Transportation Needs: Not on file  Physical Activity: Not on file  Stress: Not on file  Social Connections: Not on file    Tobacco Counseling Counseling given: Not Answered   Clinical Intake:  Pre-visit preparation completed: No  Pain : No/denies pain     BMI - recorded: 28.03 Nutritional Status: BMI 25 -29 Overweight Nutritional Risks: None, Unintentional weight gain Diabetes: No  How often do  you need to have someone help you when you read instructions, pamphlets, or other written materials from your doctor or pharmacy?: 1 - Never What is the last grade level you completed in school?: college  Diabetic?no  Interpreter Needed?: No      Activities of Daily Living No flowsheet data found.  Patient Care Team: Lesleigh Noe, MD as PCP - General (Family Medicine) Mosetta Anis, MD as Referring Physician (Allergy)  Indicate any recent Medical Services you may have received from other than [facility] providers in  the past year (date may be approximate).     Assessment:   This is a routine wellness examination for [facility].  Hearing/Vision screen No results found.  Dietary issues and exercise activities discussed:     Goals Addressed             This Visit's Progress    Patient Stated       Continue with weight watchers and exercising daily - get better from vertigo      Depression Screen PHQ 2/9 Scores 04/20/2021 07/09/2019 06/25/2017 11/25/2015  PHQ - 2 Score 0 0 0 0    Fall Risk Fall Risk  04/20/2021 04/20/2021 07/09/2019 06/25/2017  Falls in the past year? 0 0 0 No  Number falls in past yr: 0 0 - -  Injury with Fall? 0 - - -  Risk for fall due to : No Fall Risks - - -  Follow up Falls evaluation completed - Education provided;Falls prevention discussed -    FALL RISK PREVENTION PERTAINING TO THE HOME:  Any stairs in or around the home? No  If so, are there any without handrails?  N/a Home free of loose throw rugs in walkways, pet beds, electrical cords, etc? Yes  Adequate lighting in your home to reduce risk of falls? Yes   ASSISTIVE DEVICES UTILIZED TO PREVENT FALLS:  Life alert? No  Use of a cane, walker or w/c? No  Grab bars in the bathroom? Yes  Shower chair or bench in shower? No  Elevated toilet seat or a handicapped toilet? Yes    Cognitive Function:      Mini-Cog - 04/20/21 0902     Normal clock drawing test? yes    How many words  correct? 3                Immunizations Immunization History  Administered Date(s) Administered   Fluad Quad(high Dose 65+) 04/29/2019   Influenza Split 06/02/2011   Influenza Whole 06/14/2007, 05/26/2013   Influenza, High Dose Seasonal PF 07/03/2016, 07/02/2017, 07/02/2018, 06/30/2019   Influenza,inj,Quad PF,6+ Mos 04/16/2014, 05/27/2015, 05/23/2016, 05/17/2017, 04/29/2018   PFIZER(Purple Top)SARS-COV-2 Vaccination 09/12/2019, 10/07/2019, 01/19/2020   Pneumococcal Conjugate-13 06/25/2017   Pneumococcal Polysaccharide-23 08/07/2006, 07/03/2016, 11/29/2016, 07/02/2017, 07/02/2018, 06/30/2019, 11/19/2019, 01/19/2020   Zoster, Live 04/16/2014    TDAP status: Due, Education has been provided regarding the importance of this vaccine. Advised may receive this vaccine at local pharmacy or Health Dept. Aware to provide a copy of the vaccination record if obtained from local pharmacy or Health Dept. Verbalized acceptance and understanding.  Flu - pt will get tomorrow  Pneumococcal vaccine status: Up to date  Covid-19 vaccine status: Information provided on how to obtain vaccines.   Qualifies for Shingles Vaccine? Yes   Zostavax completed Yes   Shingrix Completed?: No.    Education has been provided regarding the importance of this vaccine. Patient has been advised to call insurance company to determine out of pocket expense if they have not yet received this vaccine. Advised may also receive vaccine at local pharmacy or Health Dept. Verbalized acceptance and understanding.  Screening Tests Health Maintenance  Topic Date Due   TETANUS/TDAP  Never done   Zoster Vaccines- Shingrix (1 of 2) Never done   MAMMOGRAM  02/06/2016   DEXA SCAN  Never done   COLONOSCOPY (Pts 45-39yr Insurance coverage will need to be confirmed)  09/24/2019   INFLUENZA VACCINE  03/07/2021   COVID-19 Vaccine  Completed   Hepatitis C Screening  Completed  PNA vac Low Risk Adult  Completed   HPV VACCINES   Aged Out    Health Maintenance  Health Maintenance Due  Topic Date Due   TETANUS/TDAP  Never done   Zoster Vaccines- Shingrix (1 of 2) Never done   MAMMOGRAM  02/06/2016   DEXA SCAN  Never done   COLONOSCOPY (Pts 45-9yr Insurance coverage will need to be confirmed)  09/24/2019   INFLUENZA VACCINE  03/07/2021    Colorectal cancer screening: Type of screening: Colonoscopy. Completed 2016. Repeat every 5 years  Mammogram status: Completed 2015. Repeat every year  Dexa - completed, unknown results  Lung Cancer Screening: (Low Dose CT Chest recommended if Age 70-80years, 30 pack-year currently smoking OR have quit w/in 15years.) does not qualify.   Lung Cancer Screening Referral: n/a  Additional Screening:  Hepatitis C Screening: does qualify; Completed today  Vision Screening: Recommended annual ophthalmology exams for early detection of glaucoma and other disorders of the eye. Is the patient up to date with their annual eye exam?  No , she is scheduled  Dental Screening: Recommended annual dental exams for proper oral hygiene  Community Resource Referral / Chronic Care Management: CRR required this visit?  No   CCM required this visit?  No      Plan:     I have personally reviewed and noted the following in the patient's chart:   Medical and social history Use of alcohol, tobacco or illicit drugs  Current medications and supplements including opioid prescriptions.  Functional ability and status Nutritional status Physical activity Advanced directives List of other physicians Hospitalizations, surgeries, and ER visits in previous 12 months Vitals Screenings to include cognitive, depression, and falls Referrals and appointments  In addition, I have reviewed and discussed with patient certain preventive protocols, quality metrics, and best practice recommendations. A written personalized care plan for preventive services as well as general preventive health  recommendations were provided to patient.     JLesleigh Noe MD   04/20/2021

## 2021-04-21 LAB — HEPATITIS C ANTIBODY
Hepatitis C Ab: NONREACTIVE
SIGNAL TO CUT-OFF: 0.03 (ref <1.00)

## 2021-04-21 LAB — IGE: IgE (Immunoglobulin E), Serum: 10 kU/L (ref <=114)

## 2021-04-23 ENCOUNTER — Telehealth: Payer: Self-pay

## 2021-04-23 ENCOUNTER — Ambulatory Visit: Payer: Medicare PPO

## 2021-04-23 NOTE — Telephone Encounter (Signed)
Called and spoke to patient. She informed me that she has already completed her AWV with her PCP 04/20/2021. Sw, cma

## 2021-04-25 LAB — IMMUNOGLOBULINS A/E/G/M, SERUM
IgA/Immunoglobulin A, Serum: 222 mg/dL (ref 87–352)
IgE (Immunoglobulin E), Serum: 10 IU/mL (ref 6–495)
IgG (Immunoglobin G), Serum: 729 mg/dL (ref 586–1602)
IgM (Immunoglobulin M), Srm: 88 mg/dL (ref 26–217)

## 2021-04-26 ENCOUNTER — Other Ambulatory Visit: Payer: Self-pay | Admitting: Family Medicine

## 2021-04-26 DIAGNOSIS — Z1231 Encounter for screening mammogram for malignant neoplasm of breast: Secondary | ICD-10-CM

## 2021-04-29 DIAGNOSIS — J301 Allergic rhinitis due to pollen: Secondary | ICD-10-CM | POA: Diagnosis not present

## 2021-04-29 DIAGNOSIS — J3089 Other allergic rhinitis: Secondary | ICD-10-CM | POA: Diagnosis not present

## 2021-05-05 DIAGNOSIS — J301 Allergic rhinitis due to pollen: Secondary | ICD-10-CM | POA: Diagnosis not present

## 2021-05-05 DIAGNOSIS — J3089 Other allergic rhinitis: Secondary | ICD-10-CM | POA: Diagnosis not present

## 2021-05-12 DIAGNOSIS — J301 Allergic rhinitis due to pollen: Secondary | ICD-10-CM | POA: Diagnosis not present

## 2021-05-12 DIAGNOSIS — J3089 Other allergic rhinitis: Secondary | ICD-10-CM | POA: Diagnosis not present

## 2021-05-17 DIAGNOSIS — H6121 Impacted cerumen, right ear: Secondary | ICD-10-CM | POA: Diagnosis not present

## 2021-05-17 DIAGNOSIS — R42 Dizziness and giddiness: Secondary | ICD-10-CM | POA: Diagnosis not present

## 2021-05-17 DIAGNOSIS — H903 Sensorineural hearing loss, bilateral: Secondary | ICD-10-CM | POA: Diagnosis not present

## 2021-05-17 DIAGNOSIS — H9311 Tinnitus, right ear: Secondary | ICD-10-CM | POA: Diagnosis not present

## 2021-05-20 DIAGNOSIS — J3089 Other allergic rhinitis: Secondary | ICD-10-CM | POA: Diagnosis not present

## 2021-05-20 DIAGNOSIS — J301 Allergic rhinitis due to pollen: Secondary | ICD-10-CM | POA: Diagnosis not present

## 2021-05-24 DIAGNOSIS — R42 Dizziness and giddiness: Secondary | ICD-10-CM | POA: Diagnosis not present

## 2021-05-27 ENCOUNTER — Ambulatory Visit
Admission: RE | Admit: 2021-05-27 | Discharge: 2021-05-27 | Disposition: A | Discharge status: Home / Self Care | Payer: Medicare PPO | Source: Ambulatory Visit | Attending: Family Medicine | Admitting: Family Medicine

## 2021-05-27 ENCOUNTER — Other Ambulatory Visit: Payer: Self-pay

## 2021-05-27 DIAGNOSIS — Z1231 Encounter for screening mammogram for malignant neoplasm of breast: Secondary | ICD-10-CM

## 2021-05-31 DIAGNOSIS — H8111 Benign paroxysmal vertigo, right ear: Secondary | ICD-10-CM | POA: Diagnosis not present

## 2021-05-31 DIAGNOSIS — R42 Dizziness and giddiness: Secondary | ICD-10-CM | POA: Diagnosis not present

## 2021-05-31 DIAGNOSIS — H832X1 Labyrinthine dysfunction, right ear: Secondary | ICD-10-CM | POA: Diagnosis not present

## 2021-06-03 DIAGNOSIS — J3089 Other allergic rhinitis: Secondary | ICD-10-CM | POA: Diagnosis not present

## 2021-06-03 DIAGNOSIS — J301 Allergic rhinitis due to pollen: Secondary | ICD-10-CM | POA: Diagnosis not present

## 2021-06-10 DIAGNOSIS — J3089 Other allergic rhinitis: Secondary | ICD-10-CM | POA: Diagnosis not present

## 2021-06-10 DIAGNOSIS — J301 Allergic rhinitis due to pollen: Secondary | ICD-10-CM | POA: Diagnosis not present

## 2021-06-17 DIAGNOSIS — J301 Allergic rhinitis due to pollen: Secondary | ICD-10-CM | POA: Diagnosis not present

## 2021-06-17 DIAGNOSIS — J3089 Other allergic rhinitis: Secondary | ICD-10-CM | POA: Diagnosis not present

## 2021-06-23 DIAGNOSIS — J3089 Other allergic rhinitis: Secondary | ICD-10-CM | POA: Diagnosis not present

## 2021-06-23 DIAGNOSIS — J301 Allergic rhinitis due to pollen: Secondary | ICD-10-CM | POA: Diagnosis not present

## 2021-06-29 DIAGNOSIS — H832X1 Labyrinthine dysfunction, right ear: Secondary | ICD-10-CM | POA: Diagnosis not present

## 2021-06-29 DIAGNOSIS — R42 Dizziness and giddiness: Secondary | ICD-10-CM | POA: Diagnosis not present

## 2021-07-08 DIAGNOSIS — J301 Allergic rhinitis due to pollen: Secondary | ICD-10-CM | POA: Diagnosis not present

## 2021-07-08 DIAGNOSIS — J3089 Other allergic rhinitis: Secondary | ICD-10-CM | POA: Diagnosis not present

## 2021-07-14 DIAGNOSIS — J301 Allergic rhinitis due to pollen: Secondary | ICD-10-CM | POA: Diagnosis not present

## 2021-07-14 DIAGNOSIS — J3089 Other allergic rhinitis: Secondary | ICD-10-CM | POA: Diagnosis not present

## 2021-07-22 DIAGNOSIS — J3089 Other allergic rhinitis: Secondary | ICD-10-CM | POA: Diagnosis not present

## 2021-07-22 DIAGNOSIS — J301 Allergic rhinitis due to pollen: Secondary | ICD-10-CM | POA: Diagnosis not present

## 2021-07-26 DIAGNOSIS — J301 Allergic rhinitis due to pollen: Secondary | ICD-10-CM | POA: Diagnosis not present

## 2021-07-26 DIAGNOSIS — J4530 Mild persistent asthma, uncomplicated: Secondary | ICD-10-CM | POA: Diagnosis not present

## 2021-07-26 DIAGNOSIS — J3089 Other allergic rhinitis: Secondary | ICD-10-CM | POA: Diagnosis not present

## 2021-07-26 DIAGNOSIS — H1045 Other chronic allergic conjunctivitis: Secondary | ICD-10-CM | POA: Diagnosis not present

## 2021-07-28 DIAGNOSIS — J3089 Other allergic rhinitis: Secondary | ICD-10-CM | POA: Diagnosis not present

## 2021-07-28 DIAGNOSIS — J301 Allergic rhinitis due to pollen: Secondary | ICD-10-CM | POA: Diagnosis not present

## 2021-08-04 DIAGNOSIS — J3089 Other allergic rhinitis: Secondary | ICD-10-CM | POA: Diagnosis not present

## 2021-08-04 DIAGNOSIS — J301 Allergic rhinitis due to pollen: Secondary | ICD-10-CM | POA: Diagnosis not present

## 2021-08-05 DIAGNOSIS — J301 Allergic rhinitis due to pollen: Secondary | ICD-10-CM | POA: Diagnosis not present

## 2021-08-05 DIAGNOSIS — J3089 Other allergic rhinitis: Secondary | ICD-10-CM | POA: Diagnosis not present

## 2021-08-12 DIAGNOSIS — J301 Allergic rhinitis due to pollen: Secondary | ICD-10-CM | POA: Diagnosis not present

## 2021-08-12 DIAGNOSIS — J3089 Other allergic rhinitis: Secondary | ICD-10-CM | POA: Diagnosis not present

## 2021-08-19 DIAGNOSIS — J301 Allergic rhinitis due to pollen: Secondary | ICD-10-CM | POA: Diagnosis not present

## 2021-08-19 DIAGNOSIS — J3089 Other allergic rhinitis: Secondary | ICD-10-CM | POA: Diagnosis not present

## 2021-08-24 DIAGNOSIS — L720 Epidermal cyst: Secondary | ICD-10-CM | POA: Diagnosis not present

## 2021-08-24 DIAGNOSIS — L0889 Other specified local infections of the skin and subcutaneous tissue: Secondary | ICD-10-CM | POA: Diagnosis not present

## 2021-08-26 DIAGNOSIS — J3089 Other allergic rhinitis: Secondary | ICD-10-CM | POA: Diagnosis not present

## 2021-08-26 DIAGNOSIS — J301 Allergic rhinitis due to pollen: Secondary | ICD-10-CM | POA: Diagnosis not present

## 2021-09-02 DIAGNOSIS — J301 Allergic rhinitis due to pollen: Secondary | ICD-10-CM | POA: Diagnosis not present

## 2021-09-02 DIAGNOSIS — J3089 Other allergic rhinitis: Secondary | ICD-10-CM | POA: Diagnosis not present

## 2021-09-08 DIAGNOSIS — J3089 Other allergic rhinitis: Secondary | ICD-10-CM | POA: Diagnosis not present

## 2021-09-08 DIAGNOSIS — J301 Allergic rhinitis due to pollen: Secondary | ICD-10-CM | POA: Diagnosis not present

## 2021-09-14 DIAGNOSIS — L723 Sebaceous cyst: Secondary | ICD-10-CM | POA: Diagnosis not present

## 2021-09-16 DIAGNOSIS — J3089 Other allergic rhinitis: Secondary | ICD-10-CM | POA: Diagnosis not present

## 2021-09-16 DIAGNOSIS — J301 Allergic rhinitis due to pollen: Secondary | ICD-10-CM | POA: Diagnosis not present

## 2021-09-23 DIAGNOSIS — J301 Allergic rhinitis due to pollen: Secondary | ICD-10-CM | POA: Diagnosis not present

## 2021-09-23 DIAGNOSIS — J3089 Other allergic rhinitis: Secondary | ICD-10-CM | POA: Diagnosis not present

## 2021-09-30 DIAGNOSIS — J3089 Other allergic rhinitis: Secondary | ICD-10-CM | POA: Diagnosis not present

## 2021-09-30 DIAGNOSIS — J301 Allergic rhinitis due to pollen: Secondary | ICD-10-CM | POA: Diagnosis not present

## 2021-10-07 ENCOUNTER — Ambulatory Visit
Admission: RE | Admit: 2021-10-07 | Discharge: 2021-10-07 | Disposition: A | Discharge status: Home / Self Care | Payer: Medicare PPO | Source: Ambulatory Visit | Attending: Family Medicine | Admitting: Family Medicine

## 2021-10-07 ENCOUNTER — Other Ambulatory Visit: Payer: Self-pay

## 2021-10-07 DIAGNOSIS — E2839 Other primary ovarian failure: Secondary | ICD-10-CM

## 2021-10-07 DIAGNOSIS — M810 Age-related osteoporosis without current pathological fracture: Secondary | ICD-10-CM | POA: Diagnosis not present

## 2021-10-07 DIAGNOSIS — Z780 Asymptomatic menopausal state: Secondary | ICD-10-CM | POA: Diagnosis not present

## 2021-10-07 DIAGNOSIS — M8588 Other specified disorders of bone density and structure, other site: Secondary | ICD-10-CM | POA: Diagnosis not present

## 2021-10-14 DIAGNOSIS — J301 Allergic rhinitis due to pollen: Secondary | ICD-10-CM | POA: Diagnosis not present

## 2021-10-14 DIAGNOSIS — J3089 Other allergic rhinitis: Secondary | ICD-10-CM | POA: Diagnosis not present

## 2021-10-21 DIAGNOSIS — J3089 Other allergic rhinitis: Secondary | ICD-10-CM | POA: Diagnosis not present

## 2021-10-21 DIAGNOSIS — J301 Allergic rhinitis due to pollen: Secondary | ICD-10-CM | POA: Diagnosis not present

## 2021-10-27 DIAGNOSIS — L720 Epidermal cyst: Secondary | ICD-10-CM | POA: Diagnosis not present

## 2021-10-28 DIAGNOSIS — J3089 Other allergic rhinitis: Secondary | ICD-10-CM | POA: Diagnosis not present

## 2021-10-28 DIAGNOSIS — J301 Allergic rhinitis due to pollen: Secondary | ICD-10-CM | POA: Diagnosis not present

## 2021-11-10 DIAGNOSIS — J3089 Other allergic rhinitis: Secondary | ICD-10-CM | POA: Diagnosis not present

## 2021-11-10 DIAGNOSIS — J301 Allergic rhinitis due to pollen: Secondary | ICD-10-CM | POA: Diagnosis not present

## 2021-11-17 DIAGNOSIS — J301 Allergic rhinitis due to pollen: Secondary | ICD-10-CM | POA: Diagnosis not present

## 2021-11-17 DIAGNOSIS — J3089 Other allergic rhinitis: Secondary | ICD-10-CM | POA: Diagnosis not present

## 2021-11-25 DIAGNOSIS — J301 Allergic rhinitis due to pollen: Secondary | ICD-10-CM | POA: Diagnosis not present

## 2021-11-25 DIAGNOSIS — J3089 Other allergic rhinitis: Secondary | ICD-10-CM | POA: Diagnosis not present

## 2021-12-02 DIAGNOSIS — J301 Allergic rhinitis due to pollen: Secondary | ICD-10-CM | POA: Diagnosis not present

## 2021-12-02 DIAGNOSIS — J3089 Other allergic rhinitis: Secondary | ICD-10-CM | POA: Diagnosis not present

## 2021-12-07 DIAGNOSIS — H2513 Age-related nuclear cataract, bilateral: Secondary | ICD-10-CM | POA: Diagnosis not present

## 2021-12-09 DIAGNOSIS — J3081 Allergic rhinitis due to animal (cat) (dog) hair and dander: Secondary | ICD-10-CM | POA: Diagnosis not present

## 2021-12-09 DIAGNOSIS — J3089 Other allergic rhinitis: Secondary | ICD-10-CM | POA: Diagnosis not present

## 2021-12-09 DIAGNOSIS — J301 Allergic rhinitis due to pollen: Secondary | ICD-10-CM | POA: Diagnosis not present

## 2021-12-16 DIAGNOSIS — J3089 Other allergic rhinitis: Secondary | ICD-10-CM | POA: Diagnosis not present

## 2021-12-16 DIAGNOSIS — J301 Allergic rhinitis due to pollen: Secondary | ICD-10-CM | POA: Diagnosis not present

## 2021-12-23 DIAGNOSIS — J3089 Other allergic rhinitis: Secondary | ICD-10-CM | POA: Diagnosis not present

## 2021-12-23 DIAGNOSIS — J301 Allergic rhinitis due to pollen: Secondary | ICD-10-CM | POA: Diagnosis not present

## 2021-12-30 DIAGNOSIS — J301 Allergic rhinitis due to pollen: Secondary | ICD-10-CM | POA: Diagnosis not present

## 2021-12-30 DIAGNOSIS — J3089 Other allergic rhinitis: Secondary | ICD-10-CM | POA: Diagnosis not present

## 2021-12-30 DIAGNOSIS — J3081 Allergic rhinitis due to animal (cat) (dog) hair and dander: Secondary | ICD-10-CM | POA: Diagnosis not present

## 2022-01-06 DIAGNOSIS — J3081 Allergic rhinitis due to animal (cat) (dog) hair and dander: Secondary | ICD-10-CM | POA: Diagnosis not present

## 2022-01-06 DIAGNOSIS — J3089 Other allergic rhinitis: Secondary | ICD-10-CM | POA: Diagnosis not present

## 2022-01-06 DIAGNOSIS — J301 Allergic rhinitis due to pollen: Secondary | ICD-10-CM | POA: Diagnosis not present

## 2022-01-09 DIAGNOSIS — H25813 Combined forms of age-related cataract, bilateral: Secondary | ICD-10-CM | POA: Diagnosis not present

## 2022-01-09 DIAGNOSIS — H04123 Dry eye syndrome of bilateral lacrimal glands: Secondary | ICD-10-CM | POA: Diagnosis not present

## 2022-01-13 DIAGNOSIS — J3081 Allergic rhinitis due to animal (cat) (dog) hair and dander: Secondary | ICD-10-CM | POA: Diagnosis not present

## 2022-01-13 DIAGNOSIS — J3089 Other allergic rhinitis: Secondary | ICD-10-CM | POA: Diagnosis not present

## 2022-01-13 DIAGNOSIS — J301 Allergic rhinitis due to pollen: Secondary | ICD-10-CM | POA: Diagnosis not present

## 2022-01-20 DIAGNOSIS — J3089 Other allergic rhinitis: Secondary | ICD-10-CM | POA: Diagnosis not present

## 2022-01-20 DIAGNOSIS — J3081 Allergic rhinitis due to animal (cat) (dog) hair and dander: Secondary | ICD-10-CM | POA: Diagnosis not present

## 2022-01-20 DIAGNOSIS — J301 Allergic rhinitis due to pollen: Secondary | ICD-10-CM | POA: Diagnosis not present

## 2022-01-24 DIAGNOSIS — J3089 Other allergic rhinitis: Secondary | ICD-10-CM | POA: Diagnosis not present

## 2022-01-24 DIAGNOSIS — J301 Allergic rhinitis due to pollen: Secondary | ICD-10-CM | POA: Diagnosis not present

## 2022-01-26 DIAGNOSIS — J3081 Allergic rhinitis due to animal (cat) (dog) hair and dander: Secondary | ICD-10-CM | POA: Diagnosis not present

## 2022-01-26 DIAGNOSIS — J3089 Other allergic rhinitis: Secondary | ICD-10-CM | POA: Diagnosis not present

## 2022-01-26 DIAGNOSIS — J301 Allergic rhinitis due to pollen: Secondary | ICD-10-CM | POA: Diagnosis not present

## 2022-02-03 DIAGNOSIS — J3089 Other allergic rhinitis: Secondary | ICD-10-CM | POA: Diagnosis not present

## 2022-02-03 DIAGNOSIS — J3081 Allergic rhinitis due to animal (cat) (dog) hair and dander: Secondary | ICD-10-CM | POA: Diagnosis not present

## 2022-02-03 DIAGNOSIS — J301 Allergic rhinitis due to pollen: Secondary | ICD-10-CM | POA: Diagnosis not present

## 2022-02-10 ENCOUNTER — Other Ambulatory Visit: Payer: Self-pay | Admitting: Family Medicine

## 2022-02-10 DIAGNOSIS — E785 Hyperlipidemia, unspecified: Secondary | ICD-10-CM

## 2022-02-14 DIAGNOSIS — J3081 Allergic rhinitis due to animal (cat) (dog) hair and dander: Secondary | ICD-10-CM | POA: Diagnosis not present

## 2022-02-14 DIAGNOSIS — J301 Allergic rhinitis due to pollen: Secondary | ICD-10-CM | POA: Diagnosis not present

## 2022-02-14 DIAGNOSIS — J3089 Other allergic rhinitis: Secondary | ICD-10-CM | POA: Diagnosis not present

## 2022-02-17 DIAGNOSIS — J3081 Allergic rhinitis due to animal (cat) (dog) hair and dander: Secondary | ICD-10-CM | POA: Diagnosis not present

## 2022-02-17 DIAGNOSIS — J3089 Other allergic rhinitis: Secondary | ICD-10-CM | POA: Diagnosis not present

## 2022-02-17 DIAGNOSIS — J301 Allergic rhinitis due to pollen: Secondary | ICD-10-CM | POA: Diagnosis not present

## 2022-02-20 DIAGNOSIS — J301 Allergic rhinitis due to pollen: Secondary | ICD-10-CM | POA: Diagnosis not present

## 2022-02-20 DIAGNOSIS — J3081 Allergic rhinitis due to animal (cat) (dog) hair and dander: Secondary | ICD-10-CM | POA: Diagnosis not present

## 2022-02-20 DIAGNOSIS — J3089 Other allergic rhinitis: Secondary | ICD-10-CM | POA: Diagnosis not present

## 2022-02-24 DIAGNOSIS — J301 Allergic rhinitis due to pollen: Secondary | ICD-10-CM | POA: Diagnosis not present

## 2022-02-24 DIAGNOSIS — J3089 Other allergic rhinitis: Secondary | ICD-10-CM | POA: Diagnosis not present

## 2022-02-24 DIAGNOSIS — J3081 Allergic rhinitis due to animal (cat) (dog) hair and dander: Secondary | ICD-10-CM | POA: Diagnosis not present

## 2022-03-13 DIAGNOSIS — J301 Allergic rhinitis due to pollen: Secondary | ICD-10-CM | POA: Diagnosis not present

## 2022-03-13 DIAGNOSIS — J3089 Other allergic rhinitis: Secondary | ICD-10-CM | POA: Diagnosis not present

## 2022-03-17 DIAGNOSIS — H2512 Age-related nuclear cataract, left eye: Secondary | ICD-10-CM | POA: Diagnosis not present

## 2022-03-17 DIAGNOSIS — H25812 Combined forms of age-related cataract, left eye: Secondary | ICD-10-CM | POA: Diagnosis not present

## 2022-03-24 DIAGNOSIS — J3089 Other allergic rhinitis: Secondary | ICD-10-CM | POA: Diagnosis not present

## 2022-03-24 DIAGNOSIS — J301 Allergic rhinitis due to pollen: Secondary | ICD-10-CM | POA: Diagnosis not present

## 2022-03-24 DIAGNOSIS — J3081 Allergic rhinitis due to animal (cat) (dog) hair and dander: Secondary | ICD-10-CM | POA: Diagnosis not present

## 2022-03-30 DIAGNOSIS — J3081 Allergic rhinitis due to animal (cat) (dog) hair and dander: Secondary | ICD-10-CM | POA: Diagnosis not present

## 2022-03-30 DIAGNOSIS — H2511 Age-related nuclear cataract, right eye: Secondary | ICD-10-CM | POA: Diagnosis not present

## 2022-03-30 DIAGNOSIS — J3089 Other allergic rhinitis: Secondary | ICD-10-CM | POA: Diagnosis not present

## 2022-03-30 DIAGNOSIS — J301 Allergic rhinitis due to pollen: Secondary | ICD-10-CM | POA: Diagnosis not present

## 2022-03-31 DIAGNOSIS — H2511 Age-related nuclear cataract, right eye: Secondary | ICD-10-CM | POA: Diagnosis not present

## 2022-03-31 DIAGNOSIS — H25811 Combined forms of age-related cataract, right eye: Secondary | ICD-10-CM | POA: Diagnosis not present

## 2022-04-07 DIAGNOSIS — J3081 Allergic rhinitis due to animal (cat) (dog) hair and dander: Secondary | ICD-10-CM | POA: Diagnosis not present

## 2022-04-07 DIAGNOSIS — J301 Allergic rhinitis due to pollen: Secondary | ICD-10-CM | POA: Diagnosis not present

## 2022-04-07 DIAGNOSIS — J3089 Other allergic rhinitis: Secondary | ICD-10-CM | POA: Diagnosis not present

## 2022-04-14 DIAGNOSIS — J301 Allergic rhinitis due to pollen: Secondary | ICD-10-CM | POA: Diagnosis not present

## 2022-04-14 DIAGNOSIS — J3089 Other allergic rhinitis: Secondary | ICD-10-CM | POA: Diagnosis not present

## 2022-04-21 DIAGNOSIS — J301 Allergic rhinitis due to pollen: Secondary | ICD-10-CM | POA: Diagnosis not present

## 2022-04-21 DIAGNOSIS — J3081 Allergic rhinitis due to animal (cat) (dog) hair and dander: Secondary | ICD-10-CM | POA: Diagnosis not present

## 2022-04-21 DIAGNOSIS — J3089 Other allergic rhinitis: Secondary | ICD-10-CM | POA: Diagnosis not present

## 2022-04-24 ENCOUNTER — Ambulatory Visit (INDEPENDENT_AMBULATORY_CARE_PROVIDER_SITE_OTHER): Payer: Medicare PPO | Admitting: Family Medicine

## 2022-04-24 VITALS — BP 130/80 | HR 62 | Temp 97.5°F | Ht 64.25 in | Wt 186.4 lb

## 2022-04-24 DIAGNOSIS — Z0000 Encounter for general adult medical examination without abnormal findings: Secondary | ICD-10-CM

## 2022-04-24 DIAGNOSIS — M810 Age-related osteoporosis without current pathological fracture: Secondary | ICD-10-CM | POA: Diagnosis not present

## 2022-04-24 DIAGNOSIS — E785 Hyperlipidemia, unspecified: Secondary | ICD-10-CM | POA: Diagnosis not present

## 2022-04-24 DIAGNOSIS — Z1211 Encounter for screening for malignant neoplasm of colon: Secondary | ICD-10-CM | POA: Diagnosis not present

## 2022-04-24 DIAGNOSIS — Z1231 Encounter for screening mammogram for malignant neoplasm of breast: Secondary | ICD-10-CM

## 2022-04-24 DIAGNOSIS — D692 Other nonthrombocytopenic purpura: Secondary | ICD-10-CM

## 2022-04-24 LAB — LIPID PANEL
Cholesterol: 181 mg/dL (ref 0–200)
HDL: 45.1 mg/dL (ref >39.00)
LDL Cholesterol: 101 mg/dL — ABNORMAL HIGH (ref 0–99)
NonHDL: 135.63
Total CHOL/HDL Ratio: 4
Triglycerides: 172 mg/dL — ABNORMAL HIGH (ref 0.0–149.0)
VLDL: 34.4 mg/dL (ref 0.0–40.0)

## 2022-04-24 LAB — CBC
HCT: 38.3 % (ref 36.0–46.0)
Hemoglobin: 12.9 g/dL (ref 12.0–15.0)
MCHC: 33.7 g/dL (ref 30.0–36.0)
MCV: 89.3 fl (ref 78.0–100.0)
Platelets: 290 10*3/uL (ref 150.0–400.0)
RBC: 4.29 Mil/uL (ref 3.87–5.11)
RDW: 13.7 % (ref 11.5–15.5)
WBC: 6.2 10*3/uL (ref 4.0–10.5)

## 2022-04-24 LAB — COMPREHENSIVE METABOLIC PANEL
ALT: 17 U/L (ref 0–35)
AST: 14 U/L (ref 0–37)
Albumin: 4.3 g/dL (ref 3.5–5.2)
Alkaline Phosphatase: 88 U/L (ref 39–117)
BUN: 13 mg/dL (ref 6–23)
CO2: 27 mEq/L (ref 19–32)
Calcium: 9.7 mg/dL (ref 8.4–10.5)
Chloride: 105 mEq/L (ref 96–112)
Creatinine, Ser: 0.79 mg/dL (ref 0.40–1.20)
GFR: 75.47 mL/min (ref >60.00)
Glucose, Bld: 102 mg/dL — ABNORMAL HIGH (ref 70–99)
Potassium: 4.5 mEq/L (ref 3.5–5.1)
Sodium: 141 mEq/L (ref 135–145)
Total Bilirubin: 0.5 mg/dL (ref 0.2–1.2)
Total Protein: 7.2 g/dL (ref 6.0–8.3)

## 2022-04-24 LAB — VITAMIN D 25 HYDROXY (VIT D DEFICIENCY, FRACTURES): VITD: 83.42 ng/mL (ref 30.00–100.00)

## 2022-04-24 MED ORDER — ATORVASTATIN CALCIUM 20 MG PO TABS: 20.0000 mg | ORAL_TABLET | Freq: Every day | ORAL | 3 refills | Status: DC

## 2022-04-24 NOTE — Patient Instructions (Signed)
Bone Health:   1) 800 units of Vitamin D daily 2) Get 1200 mg of elemental calcium --- this is best from your diet. Try to track how much calcium you get on a typical day. You could find ways to add more (dairy products, leafy greens). Take a supplement for whatever you don't typically get so you reach 1200 mg of calcium.  3) Physical activity (ideally weight bearing) - like walking briskly 30 minutes 5 days a week.   Repeat in 2 years

## 2022-04-24 NOTE — Progress Notes (Signed)
Subjective:   Melinda Tran is a 71 y.o. female who presents for Medicare Annual (Subsequent) preventive examination.  Review of Systems    Review of Systems  Constitutional:  Negative for chills and fever.  HENT:  Negative for congestion and sore throat.   Eyes:  Negative for blurred vision and double vision.  Respiratory:  Negative for shortness of breath.   Cardiovascular:  Negative for chest pain.  Gastrointestinal:  Negative for heartburn, nausea and vomiting.  Genitourinary: Negative.   Musculoskeletal: Negative.  Negative for myalgias.  Skin:  Negative for rash.  Neurological:  Negative for dizziness and headaches.  Endo/Heme/Allergies:  Does not bruise/bleed easily.  Psychiatric/Behavioral:  Negative for depression. The patient is not nervous/anxious.     Cardiac Risk Factors include: advanced age (>66mn, >>35women);dyslipidemia;obesity (BMI >30kg/m2)     Objective:    Today's Vitals   04/24/22 0905  BP: 130/80  Pulse: 62  Temp: (!) 97.5 F (36.4 C)  TempSrc: Temporal  SpO2: 100%  Weight: 186 lb 6 oz (84.5 kg)  Height: 5' 4.25" (1.632 m)   Body mass index is 31.74 kg/m.     04/24/2022    9:21 AM 04/20/2021    9:01 AM 01/04/2021   10:04 AM 07/09/2019   11:20 AM 09/09/2014    9:42 AM  Advanced Directives  Does Patient Have a Medical Advance Directive? Yes Yes Yes Yes Yes  Type of Advance Directive Living will Living will [place] will  Does patient want to make changes to medical advance directive? No - Patient declined No - Patient declined No - Patient declined No - Patient declined   Copy of [place] Chart?    Yes - validated most recent copy scanned in chart (See row information)     Current Medications (verified) Outpatient Encounter Medications as of 04/24/2022  Medication Sig   albuterol (VENTOLIN HFA) 108 (90 Base)  MCG/ACT inhaler Inhale 2 puffs into the lungs as needed.   calcium carbonate 200 MG capsule Take 250 mg by mouth daily.   cetirizine (ZYRTEC) 10 MG tablet Take 10 mg by mouth daily.   cholecalciferol (VITAMIN D) 1000 UNITS tablet Take 1,000 Units by mouth daily.   EPIPEN 2-PAK 0.3 MG/0.3ML SOAJ injection See admin instructions.   esomeprazole (NEXIUM) 40 MG capsule Take 40 mg by mouth daily at 12 noon.   fluticasone (FLONASE) 50 MCG/ACT nasal spray 1-2 sprays   fluticasone-salmeterol (ADVAIR HFA) 230-21 MCG/ACT inhaler Inhale 2 puffs into the lungs 2 (two) times daily.   Glucosamine-Chondroitin (GLUCOSAMINE CHONDR COMPLEX PO) Take 2 tablets by mouth daily.   montelukast (SINGULAIR) 10 MG tablet Take 10 mg by mouth at bedtime.   Spacer/Aero-Holding Chambers (AEROCHAMBER PLUS FLO-VU MEDIUM) MISC See admin instructions.   Turmeric 500 MG CAPS Take by mouth daily.   vitamin C (ASCORBIC ACID) 500 MG tablet Take 500 mg by mouth daily.   [DISCONTINUED] atorvastatin (LIPITOR) 20 MG tablet TAKE 1 TABLET BY MOUTH EVERY DAY   atorvastatin (LIPITOR) 20 MG tablet Take 1 tablet (20 mg total) by mouth daily.   [DISCONTINUED] Omega-3 1000 MG CAPS Take 2 capsules by mouth daily.   [DISCONTINUED] PROAIR RESPICLICK 1147(90 Base) MCG/ACT AEPB Inhale 2 puffs into the lungs 4 (four) times daily as needed (dyspnea, cough, wheeze).   No facility-administered encounter medications on file as of 04/24/2022.    Allergies (verified) Patient  has no known allergies.   History: Past Medical History:  Diagnosis Date   Allergy    Anemia    in the past    Asthma    GERD (gastroesophageal reflux disease)    Hyperlipidemia    Past Surgical History:  Procedure Laterality Date   ABDOMINAL HYSTERECTOMY     COLONOSCOPY     POLYPECTOMY     Family History  Problem Relation Age of Onset   Colon cancer Mother    Lung cancer Father    Prostate cancer Father    Alcohol abuse Father    Drug abuse Brother        in  recovery   Alcohol abuse Brother        in recovery   Testicular cancer Son    Stroke Maternal Grandmother    Heart attack Maternal Grandfather    Rectal cancer Neg Hx    Stomach cancer Neg Hx    Social History   Socioeconomic History   Marital status: Married    Spouse name: [name]   Number of children: 3   Years of education: College   Highest education level: Not on file  Occupational History   Not on file  Tobacco Use   Smoking status: Former    Packs/day: 0.25    Years: 5.00    Total pack years: 1.25    Types: Cigarettes    Quit date: 1977    Years since quitting: 46.7   Smokeless tobacco: Never  Vaping Use   Vaping Use: Never used  Substance and Sexual Activity   Alcohol use: Yes    Alcohol/week: 7.0 standard drinks of alcohol    Types: 7 Standard drinks or equivalent per week    Comment: 1 glass of wine a day    Drug use: No   Sexual activity: Yes    Birth control/protection: Surgical  Other Topics Concern   Not on file  Social History Narrative   11/19/19   From: the area   Living: with [name] [phone])   Work: retired - Education officer, 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      Family: 3 children in [place], 8 grandchildren       Enjoys: garden, keep grandchildren, walk 2+ miles per day      Exercise: walking 2+ miles per day   Diet: weight watchers with husband, lots of veggies - Programmer, applications belts: Yes    Guns: No   Safe in relationships: Yes    Social Determinants of Radio broadcast assistant Strain: Not on file  Food Insecurity: Not on file  Transportation Needs: Not on file  Physical Activity: Not on file  Stress: Not on file  Social Connections: Not on file    Tobacco Counseling Counseling given: Not Answered   Clinical Intake:  Pre-visit preparation completed: No  Pain : No/denies pain     BMI - recorded: 31.74 Nutritional Status: BMI > 30  Obese Nutritional Risks: None Diabetes: No  How often do you need to have  someone help you when you read instructions, pamphlets, or other written materials from your doctor or pharmacy?: 1 - Never  Diabetic?no  Interpreter Needed?: No      Activities of Daily Living    04/24/2022    9:22 AM  In your present state of health, do you have any difficulty performing the following activities:  Hearing? 0  Vision? 0  Difficulty concentrating or making  decisions? 0  Walking or climbing stairs? 1  Comment knee pain  Dressing or bathing? 0  Doing errands, shopping? 0  Preparing Food and eating ? N  Using the Toilet? N  In the past six months, have you accidently leaked urine? N  Do you have problems with loss of bowel control? N  Managing your Medications? N  Managing your Finances? N  Housekeeping or managing your Housekeeping? N    Patient Care Team: Lesleigh Noe, MD as PCP - General (Family Medicine) Mosetta Anis, MD as Referring Physician (Allergy)  Indicate any recent Medical Services you may have received from other than [facility] providers in the past year (date may be approximate).     Assessment:   This is a routine wellness examination for [facility].  Hearing/Vision screen Hearing Screening - Comments:: No concerns Vision Screening - Comments:: Has yearly eye exams at [facility]  Dietary issues and exercise activities discussed: Current Exercise Habits: Home exercise routine, Type of exercise: walking, Time (Minutes): 30, Frequency (Times/Week): 3, Weekly Exercise (Minutes/Week): 90, Intensity: Moderate, Exercise limited by: None identified   Goals Addressed             This Visit's Progress    Weight (lb) < 170 lb (77.1 kg)   186 lb 6 oz (84.5 kg)     Depression Screen    04/24/2022   10:29 AM 04/24/2022    9:22 AM 04/20/2021    8:51 AM 07/09/2019   11:24 AM 06/25/2017    8:55 AM 11/25/2015    8:54 AM  PHQ 2/9 Scores  PHQ - 2 Score 0 0 0 0 0 0    Fall Risk    04/24/2022    9:00 AM 04/20/2021    9:02 AM 04/20/2021    8:51 AM  07/09/2019   11:24 AM 06/25/2017    8:55 AM  Fall Risk   Falls in the past year? 0 0 0 0 No  Number falls in past yr: 0 0 0    Injury with Fall?  0     Risk for fall due to :  No Fall Risks     Follow up  Falls evaluation completed  Education provided;Falls prevention discussed     FALL RISK PREVENTION PERTAINING TO THE HOME:  Any stairs in or around the home? No  If so, are there any without handrails? No  Home free of loose throw rugs in walkways, pet beds, electrical cords, etc? Yes  Adequate lighting in your home to reduce risk of falls? Yes   ASSISTIVE DEVICES UTILIZED TO PREVENT FALLS:  Life alert? No  Use of a cane, walker or w/c? No  Grab bars in the bathroom? No  Shower chair or bench in shower? No  Elevated toilet seat or a handicapped toilet? No    Cognitive Function:      Mini-Cog - 04/24/22 0923     Normal clock drawing test? yes    How many words correct? 3                Immunizations Immunization History  Administered Date(s) Administered   Fluad Quad(high Dose 65+) 04/29/2019   Influenza Split 06/02/2011   Influenza Whole 06/14/2007, 05/26/2013   Influenza, High Dose Seasonal PF 07/03/2016, 07/02/2017, 07/02/2018, 06/30/2019, 07/26/2021   Influenza,inj,Quad PF,6+ Mos 04/16/2014, 05/27/2015, 05/23/2016, 05/17/2017, 04/29/2018   PFIZER(Purple Top)SARS-COV-2 Vaccination 09/12/2019, 10/07/2019, 01/19/2020, 01/21/2021, 07/26/2021   PNEUMOCOCCAL CONJUGATE-20 02/09/2022   Pfizer Covid-19 Vaccine Bivalent Booster 28yr &  up 02/09/2022   Pneumococcal Conjugate-13 06/25/2017   Pneumococcal Polysaccharide-23 08/07/2006, 07/03/2016, 11/29/2016, 07/02/2017, 07/02/2018, 06/30/2019, 11/19/2019, 01/19/2020, 07/26/2021   Respiratory Syncytial Virus Vaccine,Recomb Aduvanted(Arexvy) 04/03/2022   Zoster, Live 04/16/2014    TDAP status: Due, Education has been provided regarding the importance of this vaccine. Advised may receive this vaccine at local pharmacy  or Health Dept. Aware to provide a copy of the vaccination record if obtained from local pharmacy or Health Dept. Verbalized acceptance and understanding.  Flu Vaccine status: Due, Education has been provided regarding the importance of this vaccine. Advised may receive this vaccine at local pharmacy or Health Dept. Aware to provide a copy of the vaccination record if obtained from local pharmacy or Health Dept. Verbalized acceptance and understanding.  Pneumococcal vaccine status: Up to date  Covid-19 vaccine status: Completed vaccines  Qualifies for Shingles Vaccine? Yes   Zostavax completed Yes   Shingrix Completed?: No.    Education has been provided regarding the importance of this vaccine. Patient has been advised to call insurance company to determine out of pocket expense if they have not yet received this vaccine. Advised may also receive vaccine at local pharmacy or Health Dept. Verbalized acceptance and understanding.  Screening Tests Health Maintenance  Topic Date Due   TETANUS/TDAP  Never done   Zoster Vaccines- Shingrix (1 of 2) Never done   COLONOSCOPY (Pts 45-39yr Insurance coverage will need to be confirmed)  09/24/2019   INFLUENZA VACCINE  11/05/2022 (Originally 03/07/2022)   COVID-19 Vaccine (7 - Pfizer series) 06/12/2022   MAMMOGRAM  05/28/2023   Pneumonia Vaccine 71 Years old  Completed   DEXA SCAN  Completed   Hepatitis C Screening  Completed   HPV VACCINES  Aged Out    Health Maintenance  Health Maintenance Due  Topic Date Due   TETANUS/TDAP  Never done   Zoster Vaccines- Shingrix (1 of 2) Never done   COLONOSCOPY (Pts 45-482yrInsurance coverage will need to be confirmed)  09/24/2019    Colorectal cancer screening: Type of screening: Colonoscopy. Completed 2016. Repeat every 5 years  Mammogram status: Completed 06/2021. Repeat every year  Bone Density status: Completed 2023. Results reflect: Bone density results: OSTEOPOROSIS. Repeat every 2  years.  Lung Cancer Screening: (Low Dose CT Chest recommended if Age 71-80ears, 30 pack-year currently smoking OR have quit w/in 15years.) does not qualify.   Social History   Tobacco Use  Smoking Status Former   Packs/day: 0.25   Years: 5.00   Total pack years: 1.25   Types: Cigarettes   Quit date: 1977   Years since quitting: 46.7  Smokeless Tobacco Never     Lung Cancer Screening Referral: n/a  Additional Screening:  Hepatitis C Screening: does qualify; Completed 04/2021  Vision Screening: Recommended annual ophthalmology exams for early detection of glaucoma and other disorders of the eye. Is the patient up to date with their annual eye exam?  Yes    Dental Screening: Recommended annual dental exams for proper oral hygiene  Community Resource Referral / Chronic Care Management: CRR required this visit?  No   CCM required this visit?  No      Plan:     Problem List Items Addressed This Visit       Cardiovascular and Mediastinum   Senile purpura (HCC)   Relevant Medications   atorvastatin (LIPITOR) 20 MG tablet     Musculoskeletal and Integument   Osteoporosis    Trial of Vit D and Ca. Repeat in  1-2 years.       Relevant Orders   Comprehensive metabolic panel   Vitamin D, 25-hydroxy   CBC     Other   HLD (hyperlipidemia)   Relevant Medications   atorvastatin (LIPITOR) 20 MG tablet   Other Relevant Orders   Lipid panel   Other Visit Diagnoses     Encounter for Medicare annual wellness exam    -  Primary   Colon cancer screening       Relevant Orders   Ambulatory referral to Gastroenterology   Encounter for screening mammogram for malignant neoplasm of breast       Relevant Orders   MM 3D SCREEN BREAST BILATERAL       I have personally reviewed and noted the following in the patient's chart:   Medical and social history Use of alcohol, tobacco or illicit drugs  Current medications and supplements including opioid prescriptions.  Patient is not currently taking opioid prescriptions. Functional ability and status Nutritional status Physical activity Advanced directives List of other physicians Hospitalizations, surgeries, and ER visits in previous 12 months Vitals Screenings to include cognitive, depression, and falls Referrals and appointments  In addition, I have reviewed and discussed with patient certain preventive protocols, quality metrics, and best practice recommendations. A written personalized care plan for preventive services as well as general preventive health recommendations were provided to patient.     Lesleigh Noe, MD   04/24/2022

## 2022-04-24 NOTE — Assessment & Plan Note (Signed)
Trial of Vit D and Ca. Repeat in 1-2 years.

## 2022-04-25 ENCOUNTER — Encounter: Payer: Self-pay | Admitting: Family Medicine

## 2022-04-25 DIAGNOSIS — E785 Hyperlipidemia, unspecified: Secondary | ICD-10-CM

## 2022-04-26 MED ORDER — ATORVASTATIN CALCIUM 40 MG PO TABS: 40.0000 mg | ORAL_TABLET | Freq: Every day | ORAL | 1 refills | Status: DC

## 2022-04-26 NOTE — Telephone Encounter (Signed)
$'20mg'L$  atorvastatin cancelled at pharmacy

## 2022-04-28 DIAGNOSIS — J3089 Other allergic rhinitis: Secondary | ICD-10-CM | POA: Diagnosis not present

## 2022-04-28 DIAGNOSIS — J3081 Allergic rhinitis due to animal (cat) (dog) hair and dander: Secondary | ICD-10-CM | POA: Diagnosis not present

## 2022-04-28 DIAGNOSIS — J301 Allergic rhinitis due to pollen: Secondary | ICD-10-CM | POA: Diagnosis not present

## 2022-05-05 DIAGNOSIS — J3089 Other allergic rhinitis: Secondary | ICD-10-CM | POA: Diagnosis not present

## 2022-05-05 DIAGNOSIS — J301 Allergic rhinitis due to pollen: Secondary | ICD-10-CM | POA: Diagnosis not present

## 2022-05-12 DIAGNOSIS — J3089 Other allergic rhinitis: Secondary | ICD-10-CM | POA: Diagnosis not present

## 2022-05-12 DIAGNOSIS — J301 Allergic rhinitis due to pollen: Secondary | ICD-10-CM | POA: Diagnosis not present

## 2022-05-12 DIAGNOSIS — J3081 Allergic rhinitis due to animal (cat) (dog) hair and dander: Secondary | ICD-10-CM | POA: Diagnosis not present

## 2022-05-19 DIAGNOSIS — J3081 Allergic rhinitis due to animal (cat) (dog) hair and dander: Secondary | ICD-10-CM | POA: Diagnosis not present

## 2022-05-19 DIAGNOSIS — J3089 Other allergic rhinitis: Secondary | ICD-10-CM | POA: Diagnosis not present

## 2022-05-19 DIAGNOSIS — J301 Allergic rhinitis due to pollen: Secondary | ICD-10-CM | POA: Diagnosis not present

## 2022-05-26 DIAGNOSIS — J301 Allergic rhinitis due to pollen: Secondary | ICD-10-CM | POA: Diagnosis not present

## 2022-05-26 DIAGNOSIS — J3089 Other allergic rhinitis: Secondary | ICD-10-CM | POA: Diagnosis not present

## 2022-05-26 DIAGNOSIS — J3081 Allergic rhinitis due to animal (cat) (dog) hair and dander: Secondary | ICD-10-CM | POA: Diagnosis not present

## 2022-06-02 DIAGNOSIS — J3081 Allergic rhinitis due to animal (cat) (dog) hair and dander: Secondary | ICD-10-CM | POA: Diagnosis not present

## 2022-06-02 DIAGNOSIS — J301 Allergic rhinitis due to pollen: Secondary | ICD-10-CM | POA: Diagnosis not present

## 2022-06-02 DIAGNOSIS — J3089 Other allergic rhinitis: Secondary | ICD-10-CM | POA: Diagnosis not present

## 2022-10-31 ENCOUNTER — Telehealth: Payer: Self-pay

## 2022-10-31 DIAGNOSIS — E785 Hyperlipidemia, unspecified: Secondary | ICD-10-CM

## 2022-10-31 NOTE — Telephone Encounter (Signed)
Spoke to pt, scheduled toc with Dugal on 01/16/23

## 2022-10-31 NOTE — Telephone Encounter (Signed)
Please call patient and set up toc. Once done will send for refill of Atorvastatin.

## 2022-11-02 MED ORDER — ATORVASTATIN CALCIUM 40 MG PO TABS: 40.0000 mg | ORAL_TABLET | Freq: Every day | ORAL | 0 refills | Status: DC

## 2022-11-02 NOTE — Addendum Note (Signed)
Addended by: Helene Shoe on: 11/02/2022 12:56 PM   Modules accepted: Orders

## 2022-11-02 NOTE — Telephone Encounter (Signed)
Prescription Request  11/02/2022  LOV: 04/24/22 medicare wellness  Pt has TOC scheduled with T Dugal FNP on 01/16/2023.  What is the name of the medication or equipment? Atorvastatin 40 mg  Atorvastatin 40 mg #90 x 1 on 04/26/22.  Have you contacted your pharmacy to request a refill? Yes   Which pharmacy would you like this sent to?  [id number] [address], [place] [address] Phone: [phone] Fax: [phone]    Patient notified that their request is being sent to the clinical staff for review and that they should receive a response within 2 business days.   Please advise at  [place].    Per protocol refilled atorvastatin 40 mg # 90.

## 2023-01-16 ENCOUNTER — Encounter: Payer: Self-pay | Admitting: Family

## 2023-01-16 ENCOUNTER — Ambulatory Visit: Payer: Medicare PPO | Admitting: Family

## 2023-01-16 VITALS — BP 132/80 | HR 78 | Temp 97.6°F | Ht 64.25 in | Wt 199.2 lb

## 2023-01-16 DIAGNOSIS — Z800 Family history of malignant neoplasm of digestive organs: Secondary | ICD-10-CM

## 2023-01-16 DIAGNOSIS — Z86010 Personal history of colonic polyps: Secondary | ICD-10-CM | POA: Diagnosis not present

## 2023-01-16 DIAGNOSIS — K219 Gastro-esophageal reflux disease without esophagitis: Secondary | ICD-10-CM | POA: Diagnosis not present

## 2023-01-16 DIAGNOSIS — Z1211 Encounter for screening for malignant neoplasm of colon: Secondary | ICD-10-CM | POA: Diagnosis not present

## 2023-01-16 DIAGNOSIS — E785 Hyperlipidemia, unspecified: Secondary | ICD-10-CM | POA: Diagnosis not present

## 2023-01-16 DIAGNOSIS — J4489 Other specified chronic obstructive pulmonary disease: Secondary | ICD-10-CM

## 2023-01-16 MED ORDER — ATORVASTATIN CALCIUM 40 MG PO TABS: 40.0000 mg | ORAL_TABLET | Freq: Every day | ORAL | 3 refills | Status: DC

## 2023-01-16 NOTE — Progress Notes (Signed)
Established Patient Office Visit  Subjective:  Patient ID: Melinda Tran, female    DOB: [birth year]  Age: 72 y.o. MRN: [id number]  CC:  Chief Complaint  Patient presents with   Establish Care    TOC from Dr Melinda Tran    HPI Melinda Tran is here for a transition of care visit.  Prior provider was: Dr. Gweneth Tran    Pt is without acute concerns.   chronic concerns:  Seasonal allergies: does have asthma which is worsened with going outside.  She is not a smoker . She does see an allergist , has epi pen on board as she does go for weekly allergy shots. On Singulair 10 mg nightly , on zyrtec nightly and flonase daily.   HLD: on atorvastatin 40 mg once daily , needing refill today.   GERD: takes nexium 20 mg once daily over the counter.  Does help with her heartburn symptoms. Has been on this for years. She does drink coffee, about two cups a day, does not eat a lot of fried fatty or spicy foods.   Asthma intermittent, seasonal, not with acute exacerbation: on wixela 1 puff twice daily and albuterol prn. She only uses albuterol every few weeks prn  Vertigo: history of this however no recent episodes however at times she does have balance. She has seen ENT in the past as well as went for physical therapy, she does state that the Ent told her that it will take awhile for her body to retrain its balance.    Past Medical History:  Diagnosis Date   Allergy    Anemia    in the past    Asthma    GERD (gastroesophageal reflux disease)    Hyperlipidemia     Past Surgical History:  Procedure Laterality Date   COLONOSCOPY     partial hysterectom     still with bil ovaries   POLYPECTOMY      Family History  Problem Relation Age of Onset   Colon cancer Mother    Lung cancer Father    Prostate cancer Father    Alcohol abuse Father    Drug abuse Brother        in recovery   Alcohol abuse Brother        in recovery   Stroke Maternal Grandmother    Heart attack Maternal  Grandfather        early 2's   Testicular cancer Son    Rectal cancer Neg Hx    Stomach cancer Neg Hx     Social History   Socioeconomic History   Marital status: Married    Spouse name: Kathlene November   Number of children: 3   Years of education: College   Highest education level: Not on file  Occupational History   Occupation: retired since 2006  Tobacco Use   Smoking status: Former    Packs/day: 0.25    Years: 5.00    Additional pack years: 0.00    Total pack years: 1.25    Types: Cigarettes    Quit date: 1977    Years since quitting: 47.4   Smokeless tobacco: Never  Vaping Use   Vaping Use: Never used  Substance and Sexual Activity   Alcohol use: Yes    Alcohol/week: 7.0 standard drinks of alcohol    Types: 7 Standard drinks or equivalent per week    Comment: 1 glass of wine a day    Drug use: No   Sexual  activity: Yes    Partners: Male    Birth control/protection: Surgical  Other Topics Concern   Not on file  Social History Narrative   11/19/19   From: the area   Living: with Kathlene November [phone])   Work: retired - Engineer, site - elementary education      Family: 3 children in [place], 8 grandchildren       Enjoys: garden, keep grandchildren, walk 2+ miles per day      Exercise: walking 2+ miles per day   Diet: weight watchers with husband, lots of veggies - Programmer, systems belts: Yes    Guns: No   Safe in relationships: Yes    Social Determinants of Corporate investment banker Strain: Not on file  Food Insecurity: Not on file  Transportation Needs: Not on file  Physical Activity: Not on file  Stress: Not on file  Social Connections: Not on file  Intimate Partner Violence: Not on file    Outpatient Medications Prior to Visit  Medication Sig Dispense Refill   albuterol (VENTOLIN HFA) 108 (90 Base) MCG/ACT inhaler Inhale 2 puffs into the lungs as needed.     calcium carbonate 200 MG capsule Take 250 mg by mouth daily.     cetirizine  (ZYRTEC) 10 MG tablet Take 10 mg by mouth daily.     cholecalciferol (VITAMIN D) 1000 UNITS tablet Take 1,000 Units by mouth daily.     EPIPEN 2-PAK 0.3 MG/0.3ML SOAJ injection See admin instructions.     esomeprazole (NEXIUM) 20 MG capsule Take 20 mg by mouth daily at 12 noon.     fluticasone (FLONASE) 50 MCG/ACT nasal spray 1-2 sprays     Glucosamine-Chondroitin (GLUCOSAMINE CHONDR COMPLEX PO) Take 2 tablets by mouth daily.     montelukast (SINGULAIR) 10 MG tablet Take 10 mg by mouth at bedtime.     Spacer/Aero-Holding Chambers (AEROCHAMBER PLUS FLO-VU MEDIUM) MISC See admin instructions.     Turmeric 500 MG CAPS Take by mouth daily.     vitamin C (ASCORBIC ACID) 500 MG tablet Take 500 mg by mouth daily.     WIXELA INHUB 250-50 MCG/ACT AEPB Inhale 1 puff into the lungs 2 (two) times daily.     atorvastatin (LIPITOR) 40 MG tablet Take 1 tablet (40 mg total) by mouth daily. 90 tablet 0   esomeprazole (NEXIUM) 40 MG capsule Take 40 mg by mouth daily at 12 noon.     fluticasone-salmeterol (ADVAIR HFA) 230-21 MCG/ACT inhaler Inhale 2 puffs into the lungs 2 (two) times daily. Wixela     No facility-administered medications prior to visit.    Allergies  Allergen Reactions   Dust Mite Mixed Allergen Ext [Mite (D. Farinae)] Cough   Mixed Grasses Cough   Pollen Extract-Tree Extract [Pollen Extract] Cough    ROS: Pertinent symptoms negative unless otherwise noted in HPI      Objective:    Physical Exam Vitals reviewed.  Constitutional:      Appearance: Normal appearance.  Eyes:     General:        Right eye: No discharge.        Left eye: No discharge.     Conjunctiva/sclera: Conjunctivae normal.  Cardiovascular:     Rate and Rhythm: Normal rate and regular rhythm.  Pulmonary:     Effort: Pulmonary effort is normal. No respiratory distress.  Musculoskeletal:        General: Normal range of motion.  Cervical back: Normal range of motion.  Neurological:     General: No focal  deficit present.     Mental Status: She is alert and oriented to person, place, and time. Mental status is at baseline.  Psychiatric:        Mood and Affect: Mood normal.        Behavior: Behavior normal.        Thought Content: Thought content normal.        Judgment: Judgment normal.       BP 132/80   Pulse 78   Temp 97.6 F (36.4 C) (Temporal)   Ht 5' 4.25" (1.632 m)   Wt 199 lb 3.2 oz (90.4 kg)   SpO2 98%   BMI 33.93 kg/m  Wt Readings from Last 3 Encounters:  01/16/23 199 lb 3.2 oz (90.4 kg)  04/24/22 186 lb 6 oz (84.5 kg)  04/20/21 167 lb 2 oz (75.8 kg)     Health Maintenance Due  Topic Date Due   DTaP/Tdap/Td (1 - Tdap) Never done   Zoster Vaccines- Shingrix (1 of 2) Never done   Colonoscopy  09/24/2019    There are no preventive care reminders to display for this patient.  Lab Results  Component Value Date   TSH 3.43 04/29/2018   Lab Results  Component Value Date   WBC 6.2 04/24/2022   HGB 12.9 04/24/2022   HCT 38.3 04/24/2022   MCV 89.3 04/24/2022   PLT 290.0 04/24/2022   Lab Results  Component Value Date   NA 141 04/24/2022   K 4.5 04/24/2022   CO2 27 04/24/2022   GLUCOSE 102 (H) 04/24/2022   BUN 13 04/24/2022   CREATININE 0.79 04/24/2022   BILITOT 0.5 04/24/2022   ALKPHOS 88 04/24/2022   AST 14 04/24/2022   ALT 17 04/24/2022   PROT 7.2 04/24/2022   ALBUMIN 4.3 04/24/2022   CALCIUM 9.7 04/24/2022   GFR 75.47 04/24/2022   Lab Results  Component Value Date   CHOL 181 04/24/2022   Lab Results  Component Value Date   HDL 45.10 04/24/2022   Lab Results  Component Value Date   LDLCALC 101 (H) 04/24/2022   Lab Results  Component Value Date   TRIG 172.0 (H) 04/24/2022   Lab Results  Component Value Date   CHOLHDL 4 04/24/2022   Lab Results  Component Value Date   HGBA1C 5.1 11/19/2019      Assessment & Plan:   History of colon polyps  Hyperlipidemia, unspecified hyperlipidemia type Assessment & Plan: Refill  atorvastatin 40 mg once daily.  Work on low cholesterol diet and exercise as tolerated   Orders: -     Atorvastatin Calcium; Take 1 tablet (40 mg total) by mouth daily.  Dispense: 90 tablet; Refill: 3  Screening for colon cancer -     Ambulatory referral to Gastroenterology  Gastroesophageal reflux disease, unspecified whether esophagitis present Assessment & Plan: Stable. Try to decrease and or avoid spicy foods, fried fatty foods, and also caffeine and chocolate as these can increase heartburn symptoms.     Asthmatic bronchitis , chronic Assessment & Plan: Stable continue with wixela, singulair, and zyrtec and flonase.  Continue with allergist.   Family history of colon cancer in mother Assessment & Plan: Referral placed for GI for possible colonoscopy.      Meds ordered this encounter  Medications   atorvastatin (LIPITOR) 40 MG tablet    Sig: Take 1 tablet (40 mg total) by mouth daily.  Dispense:  90 tablet    Refill:  3    Follow-up: Return for f/u after september 2024, f/u CPE.    Mort Sawyers, FNP

## 2023-01-16 NOTE — Assessment & Plan Note (Signed)
Stable continue with wixela, singulair, and zyrtec and flonase.  Continue with allergist.

## 2023-01-16 NOTE — Assessment & Plan Note (Signed)
Referral placed for GI for possible colonoscopy. 

## 2023-01-16 NOTE — Assessment & Plan Note (Signed)
Refill atorvastatin 40 mg once daily.  Work on low cholesterol diet and exercise as tolerated

## 2023-01-16 NOTE — Assessment & Plan Note (Signed)
Stable  Try to decrease and or avoid spicy foods, fried fatty foods, and also caffeine and chocolate as these can increase heartburn symptoms.   

## 2023-01-25 ENCOUNTER — Encounter: Payer: Self-pay | Admitting: *Deleted

## 2023-04-13 DIAGNOSIS — J3081 Allergic rhinitis due to animal (cat) (dog) hair and dander: Secondary | ICD-10-CM | POA: Diagnosis not present

## 2023-04-13 DIAGNOSIS — J301 Allergic rhinitis due to pollen: Secondary | ICD-10-CM | POA: Diagnosis not present

## 2023-04-13 DIAGNOSIS — J3089 Other allergic rhinitis: Secondary | ICD-10-CM | POA: Diagnosis not present

## 2023-04-20 DIAGNOSIS — J3089 Other allergic rhinitis: Secondary | ICD-10-CM | POA: Diagnosis not present

## 2023-04-20 DIAGNOSIS — J301 Allergic rhinitis due to pollen: Secondary | ICD-10-CM | POA: Diagnosis not present

## 2023-04-26 DIAGNOSIS — J3089 Other allergic rhinitis: Secondary | ICD-10-CM | POA: Diagnosis not present

## 2023-04-26 DIAGNOSIS — J3081 Allergic rhinitis due to animal (cat) (dog) hair and dander: Secondary | ICD-10-CM | POA: Diagnosis not present

## 2023-04-26 DIAGNOSIS — J301 Allergic rhinitis due to pollen: Secondary | ICD-10-CM | POA: Diagnosis not present

## 2023-05-04 DIAGNOSIS — J301 Allergic rhinitis due to pollen: Secondary | ICD-10-CM | POA: Diagnosis not present

## 2023-05-04 DIAGNOSIS — J3089 Other allergic rhinitis: Secondary | ICD-10-CM | POA: Diagnosis not present

## 2023-05-04 DIAGNOSIS — J3081 Allergic rhinitis due to animal (cat) (dog) hair and dander: Secondary | ICD-10-CM | POA: Diagnosis not present

## 2023-05-18 DIAGNOSIS — J3081 Allergic rhinitis due to animal (cat) (dog) hair and dander: Secondary | ICD-10-CM | POA: Diagnosis not present

## 2023-05-18 DIAGNOSIS — J3089 Other allergic rhinitis: Secondary | ICD-10-CM | POA: Diagnosis not present

## 2023-05-18 DIAGNOSIS — J301 Allergic rhinitis due to pollen: Secondary | ICD-10-CM | POA: Diagnosis not present

## 2023-05-25 DIAGNOSIS — J301 Allergic rhinitis due to pollen: Secondary | ICD-10-CM | POA: Diagnosis not present

## 2023-05-25 DIAGNOSIS — J3081 Allergic rhinitis due to animal (cat) (dog) hair and dander: Secondary | ICD-10-CM | POA: Diagnosis not present

## 2023-05-25 DIAGNOSIS — J3089 Other allergic rhinitis: Secondary | ICD-10-CM | POA: Diagnosis not present

## 2023-06-01 DIAGNOSIS — J301 Allergic rhinitis due to pollen: Secondary | ICD-10-CM | POA: Diagnosis not present

## 2023-06-01 DIAGNOSIS — J3081 Allergic rhinitis due to animal (cat) (dog) hair and dander: Secondary | ICD-10-CM | POA: Diagnosis not present

## 2023-06-01 DIAGNOSIS — J3089 Other allergic rhinitis: Secondary | ICD-10-CM | POA: Diagnosis not present

## 2023-06-05 DIAGNOSIS — Z961 Presence of intraocular lens: Secondary | ICD-10-CM | POA: Diagnosis not present

## 2023-06-08 DIAGNOSIS — J3081 Allergic rhinitis due to animal (cat) (dog) hair and dander: Secondary | ICD-10-CM | POA: Diagnosis not present

## 2023-06-08 DIAGNOSIS — J301 Allergic rhinitis due to pollen: Secondary | ICD-10-CM | POA: Diagnosis not present

## 2023-06-08 DIAGNOSIS — J3089 Other allergic rhinitis: Secondary | ICD-10-CM | POA: Diagnosis not present

## 2023-06-15 DIAGNOSIS — J3089 Other allergic rhinitis: Secondary | ICD-10-CM | POA: Diagnosis not present

## 2023-06-15 DIAGNOSIS — J301 Allergic rhinitis due to pollen: Secondary | ICD-10-CM | POA: Diagnosis not present

## 2023-06-15 DIAGNOSIS — J3081 Allergic rhinitis due to animal (cat) (dog) hair and dander: Secondary | ICD-10-CM | POA: Diagnosis not present

## 2023-06-19 DIAGNOSIS — J301 Allergic rhinitis due to pollen: Secondary | ICD-10-CM | POA: Diagnosis not present

## 2023-06-19 DIAGNOSIS — J3089 Other allergic rhinitis: Secondary | ICD-10-CM | POA: Diagnosis not present

## 2023-06-26 ENCOUNTER — Ambulatory Visit (INDEPENDENT_AMBULATORY_CARE_PROVIDER_SITE_OTHER): Payer: Medicare PPO

## 2023-06-26 VITALS — Ht 65.0 in | Wt 199.0 lb

## 2023-06-26 DIAGNOSIS — J3089 Other allergic rhinitis: Secondary | ICD-10-CM | POA: Diagnosis not present

## 2023-06-26 DIAGNOSIS — Z0000 Encounter for general adult medical examination without abnormal findings: Secondary | ICD-10-CM

## 2023-06-26 DIAGNOSIS — Z1231 Encounter for screening mammogram for malignant neoplasm of breast: Secondary | ICD-10-CM

## 2023-06-26 DIAGNOSIS — Z1211 Encounter for screening for malignant neoplasm of colon: Secondary | ICD-10-CM

## 2023-06-26 DIAGNOSIS — H1045 Other chronic allergic conjunctivitis: Secondary | ICD-10-CM | POA: Diagnosis not present

## 2023-06-26 DIAGNOSIS — J4530 Mild persistent asthma, uncomplicated: Secondary | ICD-10-CM | POA: Diagnosis not present

## 2023-06-26 DIAGNOSIS — J301 Allergic rhinitis due to pollen: Secondary | ICD-10-CM | POA: Diagnosis not present

## 2023-06-26 NOTE — Patient Instructions (Addendum)
Melinda Tran , Thank you for taking time to come for your Medicare Wellness Visit. I appreciate your ongoing commitment to your health goals. Please review the following plan we discussed and let me know if I can assist you in the future.   Referrals/Orders/Follow-Ups/Clinician Recommendations:    You have been referred to see a gastroenterologist to discuss a colon cancer screening. If you haven't heard from that office in 7 business days, please call them to schedule your appointment.  [email or web address] [address] [place], [place], [place] [address]  3.8 mi [phone] Open  Closes 5 PM  A order has been made for mammogram for you. Please call the office below to schedule if you do not hear from them in 7 days. [email or web address] [address] [place], [place], [place] [address]  2.7 mi [phone] Open  Closes 5:30 PM    This is a list of the screening recommended for you and due dates:  Health Maintenance  Topic Date Due   COVID-19 Vaccine (7 - 2023-24 season) 07/12/2023*   Zoster (Shingles) Vaccine (1 of 2) 09/26/2023*   DTaP/Tdap/Td vaccine (1 - Tdap) 06/25/2024*   Mammogram  06/25/2024*   Colon Cancer Screening  06/25/2024*   Medicare Annual Wellness Visit  06/25/2024   Pneumonia Vaccine  Completed   Flu Shot  Completed   DEXA scan (bone density measurement)  Completed   Hepatitis C Screening  Completed   HPV Vaccine  Aged Out  *Topic was postponed. The date shown is not the original due date.    Advanced directives: (Copy Requested) Please bring a copy of your health care power of attorney and living will to the office to be added to your chart at your convenience.  Next Medicare Annual Wellness Visit scheduled for next year: Yes 06/26/24 @ 1pm telephone

## 2023-06-26 NOTE — Progress Notes (Signed)
Subjective:   Melinda Tran is a 72 y.o. female who presents for Medicare Annual (Subsequent) preventive examination.  Visit Complete: Virtual I connected with  [name] on 06/26/23 by a audio enabled telemedicine application and verified that I am speaking with the correct person using two identifiers.  Patient Location: Home  Provider Location: Office/Clinic  I discussed the limitations of evaluation and management by telemedicine. The patient expressed understanding and agreed to proceed.  Vital Signs: Because this visit was a virtual/telehealth visit, some criteria may be missing or patient reported. Any vitals not documented were not able to be obtained and vitals that have been documented are patient reported.  Patient Medicare AWV questionnaire was completed by the patient on 06/23/23; I have confirmed that all information answered by patient is correct and no changes since this date. Cardiac Risk Factors include: advanced age (>15men, >69 women);dyslipidemia;obesity (BMI >30kg/m2);sedentary lifestyle    Objective:    Today's Vitals   06/26/23 0953  Weight: 199 lb (90.3 kg)  Height: 5\' 5"  (1.651 m)   Body mass index is 33.12 kg/m.     06/26/2023   10:02 AM 04/24/2022    9:21 AM 04/20/2021    9:01 AM 01/04/2021   10:04 AM 07/09/2019   11:20 AM 09/09/2014    9:42 AM  Advanced Directives  Does Patient Have a Medical Advance Directive? Yes Yes Yes Yes Yes Yes  Type of Estate agent of [place];Living will Living will Living will Healthcare Power of [place];Living will Healthcare Power of [place];Living will Healthcare Power of [place];Living will  Does patient want to make changes to medical advance directive?  No - Patient declined No - Patient declined No - Patient declined No - Patient declined   Copy of Healthcare Power of Attorney in Chart? No - copy requested    Yes - validated most recent copy scanned in chart (See row information)      Current Medications (verified) Outpatient Encounter Medications as of 06/26/2023  Medication Sig   albuterol (VENTOLIN HFA) 108 (90 Base) MCG/ACT inhaler Inhale 2 puffs into the lungs as needed.   atorvastatin (LIPITOR) 40 MG tablet Take 1 tablet (40 mg total) by mouth daily.   calcium carbonate 200 MG capsule Take 250 mg by mouth daily.   cetirizine (ZYRTEC) 10 MG tablet Take 10 mg by mouth daily.   cholecalciferol (VITAMIN D) 1000 UNITS tablet Take 1,000 Units by mouth daily.   EPIPEN 2-PAK 0.3 MG/0.3ML SOAJ injection See admin instructions.   esomeprazole (NEXIUM) 20 MG capsule Take 20 mg by mouth daily at 12 noon.   fluticasone (FLONASE) 50 MCG/ACT nasal spray 1-2 sprays   Glucosamine-Chondroitin (GLUCOSAMINE CHONDR COMPLEX PO) Take 2 tablets by mouth daily.   montelukast (SINGULAIR) 10 MG tablet Take 10 mg by mouth at bedtime.   Spacer/Aero-Holding Chambers (AEROCHAMBER PLUS FLO-VU MEDIUM) MISC See admin instructions.   Turmeric 500 MG CAPS Take by mouth daily.   vitamin C (ASCORBIC ACID) 500 MG tablet Take 500 mg by mouth daily.   WIXELA INHUB 250-50 MCG/ACT AEPB Inhale 1 puff into the lungs 2 (two) times daily.   No facility-administered encounter medications on file as of 06/26/2023.    Allergies (verified) Dust mite mixed allergen ext [mite (d. farinae)], Mixed grasses, and Pollen extract-tree extract [pollen extract]   History: Past Medical History:  Diagnosis Date   Allergy    Anemia    in the past    Asthma  GERD (gastroesophageal reflux disease)    Hyperlipidemia    Past Surgical History:  Procedure Laterality Date   COLONOSCOPY     partial hysterectom     still with bil ovaries   POLYPECTOMY     Family History  Problem Relation Age of Onset   Colon cancer Mother    Lung cancer Father    Prostate cancer Father    Alcohol abuse Father    Drug abuse Brother        in recovery   Alcohol abuse Brother        in recovery   Stroke Maternal Grandmother     Heart attack Maternal Grandfather        early 73's   Testicular cancer Son    Rectal cancer Neg Hx    Stomach cancer Neg Hx    Social History   Socioeconomic History   Marital status: Married    Spouse name: Kathlene November   Number of children: 3   Years of education: College   Highest education level: Not on file  Occupational History   Occupation: retired since 2006  Tobacco Use   Smoking status: Former    Current packs/day: 0.00    Average packs/day: 0.3 packs/day for 5.0 years (1.3 ttl pk-yrs)    Types: Cigarettes    Start date: 35    Quit date: 1977    Years since quitting: 47.9   Smokeless tobacco: Never  Vaping Use   Vaping status: Never Used  Substance and Sexual Activity   Alcohol use: Yes    Alcohol/week: 7.0 standard drinks of alcohol    Types: 7 Standard drinks or equivalent per week    Comment: 1 glass of wine a day    Drug use: No   Sexual activity: Yes    Partners: Male    Birth control/protection: Surgical  Other Topics Concern   Not on file  Social History Narrative   11/19/19   From: the area   Living: with Kathlene November [phone])   Work: retired - Engineer, site - elementary education      Family: 3 children in [place], 8 grandchildren       Enjoys: garden, keep grandchildren, walk 2+ miles per day      Exercise: walking 2+ miles per day   Diet: weight watchers with husband, lots of veggies - Programmer, systems belts: Yes    Guns: No   Safe in relationships: Yes    Social Determinants of Health   Financial Resource Strain: Low Risk  (06/23/2023)   Overall Financial Resource Strain (CARDIA)    Difficulty of Paying Living Expenses: Not hard at all  Food Insecurity: No Food Insecurity (06/23/2023)   Hunger Vital Sign    Worried About Running Out of Food in the Last Year: Never true    Ran Out of Food in the Last Year: Never true  Transportation Needs: No Transportation Needs (06/23/2023)   PRAPARE - Scientist, research (physical sciences) (Medical): No    Lack of Transportation (Non-Medical): No  Physical Activity: Insufficiently Active (06/23/2023)   Exercise Vital Sign    Days of Exercise per Week: 1 day    Minutes of Exercise per Session: 20 min  Stress: No Stress Concern Present (06/23/2023)   [employer] of Occupational Health - Occupational Stress Questionnaire    Feeling of Stress : Only a little  Social Connections: Unknown (06/23/2023)   Social Connection  and Isolation Panel [NHANES]    Frequency of Communication with Friends and Family: More than three times a week    Frequency of Social Gatherings with Friends and Family: Once a week    Attends Religious Services: Not on Marketing executive or Organizations: No    Attends Banker Meetings: Never    Marital Status: Married    Tobacco Counseling Counseling given: Not Answered  Clinical Intake:  Pre-visit preparation completed: Yes  Pain : No/denies pain    BMI - recorded: 33.12 Nutritional Status: BMI > 30  Obese Nutritional Risks: None Diabetes: No  How often do you need to have someone help you when you read instructions, pamphlets, or other written materials from your doctor or pharmacy?: 1 - Never  Interpreter Needed?: No  Comments: liuves with husband Information entered by :: B.Ahmia Colford,LPN   Activities of Daily Living    06/23/2023    6:59 AM  In your present state of health, do you have any difficulty performing the following activities:  Hearing? 0  Vision? 0  Difficulty concentrating or making decisions? 0  Walking or climbing stairs? 1  Dressing or bathing? 0  Doing errands, shopping? 0  Preparing Food and eating ? N  Using the Toilet? N  In the past six months, have you accidently leaked urine? N  Do you have problems with loss of bowel control? N  Managing your Medications? N  Managing your Finances? N  Housekeeping or managing your Housekeeping? N    Patient Care  Team: Mort Sawyers, FNP as PCP - General (Family Medicine) Sidney Ace, MD as Referring Physician (Allergy) [facility], [place]  Indicate any recent Medical Services you may have received from other than [facility] providers in the past year (date may be approximate).     Assessment:   This is a routine wellness examination for [facility].  Hearing/Vision screen Hearing Screening - Comments:: Pt says her hearing is good Vision Screening - Comments:: Pt says her vision is perfect after cataract surgery last year [facility]   Goals Addressed             This Visit's Progress    Patient Stated   Not on track    Continue with weight watchers and exercising daily - get better from vertigo       Depression Screen    06/26/2023   10:00 AM 04/24/2022   10:29 AM 04/24/2022    9:22 AM 04/20/2021    8:51 AM 07/09/2019   11:24 AM 06/25/2017    8:55 AM 11/25/2015    8:54 AM  PHQ 2/9 Scores  PHQ - 2 Score 0 0 0 0 0 0 0    Fall Risk    06/23/2023    6:59 AM 04/24/2022    9:00 AM 04/20/2021    9:02 AM 04/20/2021    8:51 AM 07/09/2019   11:24 AM  Fall Risk   Falls in the past year? 0 0 0 0 0  Number falls in past yr: 0 0 0 0   Injury with Fall? 0  0    Risk for fall due to : No Fall Risks  No Fall Risks    Follow up Education provided;Falls prevention discussed  Falls evaluation completed  Education provided;Falls prevention discussed    MEDICARE RISK AT HOME: Medicare Risk at Home Any stairs in or around the home?: No If so, are there any without handrails?:  No Home free of loose throw rugs in walkways, pet beds, electrical cords, etc?: Yes Adequate lighting in your home to reduce risk of falls?: Yes Life alert?: No Use of a cane, walker or w/c?: No Grab bars in the bathroom?: No Shower chair or bench in shower?: No Elevated toilet seat or a handicapped toilet?: Yes  TIMED UP AND GO:  Was the test performed?  No    Cognitive Function:        06/26/2023    10:04 AM  6CIT Screen  What Year? 0 points  What month? 0 points  What time? 0 points  Count back from 20 0 points  Months in reverse 0 points  Repeat phrase 0 points  Total Score 0 points    Immunizations Immunization History  Administered Date(s) Administered   Fluad Quad(high Dose 65+) 04/29/2019, 05/02/2022   Influenza Split 06/02/2011   Influenza Whole 06/14/2007, 05/26/2013   Influenza, High Dose Seasonal PF 07/03/2016, 07/02/2017, 07/02/2018, 06/30/2019, 07/26/2021   Influenza,inj,Quad PF,6+ Mos 04/16/2014, 05/27/2015, 05/23/2016, 05/17/2017, 04/29/2018   Influenza-Unspecified 05/22/2023   PFIZER(Purple Top)SARS-COV-2 Vaccination 09/12/2019, 10/07/2019, 01/19/2020, 01/21/2021, 07/26/2021   PNEUMOCOCCAL CONJUGATE-20 02/09/2022   Pfizer Covid-19 Vaccine Bivalent Booster 64yrs & up 02/09/2022   Pneumococcal Conjugate-13 06/25/2017   Pneumococcal Polysaccharide-23 08/07/2006, 07/03/2016, 11/29/2016, 07/02/2017, 07/02/2018, 06/30/2019, 11/19/2019, 01/19/2020, 07/26/2021   Respiratory Syncytial Virus Vaccine,Recomb Aduvanted(Arexvy) 04/03/2022   Zoster, Live 04/16/2014    TDAP status: Up to date  Flu Vaccine status: Up to date  Pneumococcal vaccine status: Up to date  Covid-19 vaccine status: Completed vaccines  Qualifies for Shingles Vaccine? Yes   Zostavax completed Yes   Shingrix Completed?: Yes  Screening Tests Health Maintenance  Topic Date Due   COVID-19 Vaccine (7 - 2023-24 season) 07/12/2023 (Originally 04/08/2023)   Zoster Vaccines- Shingrix (1 of 2) 09/26/2023 (Originally 04/16/2001)   DTaP/Tdap/Td (1 - Tdap) 06/25/2024 (Originally 04/16/1970)   MAMMOGRAM  06/25/2024 (Originally 05/28/2023)   Colonoscopy  06/25/2024 (Originally 09/24/2019)   Medicare Annual Wellness (AWV)  06/25/2024   Pneumonia Vaccine 67+ Years old  Completed   INFLUENZA VACCINE  Completed   DEXA SCAN  Completed   Hepatitis C Screening  Completed   HPV VACCINES  Aged Out    Health  Maintenance  There are no preventive care reminders to display for this patient.   Colorectal cancer screening: Referral to GI placed yes. Pt aware the office will call re: appt.  Mammogram status: Ordered yes. Pt provided with contact info and advised to call to schedule appt.   Bone Density status: Completed 10/07/2021. Results reflect: Bone density results: OSTEOPOROSIS. Repeat every 3 years.  Lung Cancer Screening: (Low Dose CT Chest recommended if Age 51-80 years, 20 pack-year currently smoking OR have quit w/in 15years.) does not qualify.   Lung Cancer Screening Referral: no  Additional Screening:  Hepatitis C Screening: does not qualify; Completed 04/21/23  Vision Screening: Recommended annual ophthalmology exams for early detection of glaucoma and other disorders of the eye. Is the patient up to date with their annual eye exam?  Yes  Who is the provider or what is the name of the office in which the patient attends annual eye exams? Dr [place] If pt is not established with a provider, would they like to be referred to a provider to establish care? No .   Dental Screening: Recommended annual dental exams for proper oral hygiene  Diabetic Foot Exam: n/a  Community Resource Referral / Chronic Care Management: CRR required this  visit?  No   CCM required this visit?  No    Plan:     I have personally reviewed and noted the following in the patient's chart:   Medical and social history Use of alcohol, tobacco or illicit drugs  Current medications and supplements including opioid prescriptions. Patient is not currently taking opioid prescriptions. Functional ability and status Nutritional status Physical activity Advanced directives List of other physicians Hospitalizations, surgeries, and ER visits in previous 12 months Vitals Screenings to include cognitive, depression, and falls Referrals and appointments  In addition, I have reviewed and discussed with patient  certain preventive protocols, quality metrics, and best practice recommendations. A written personalized care plan for preventive services as well as general preventive health recommendations were provided to patient.   Sue Lush, LPN   [id number]   After Visit Summary: (MyChart) Due to this being a telephonic visit, the after visit summary with patients personalized plan was offered to patient via MyChart   Nurse Notes: The patient states she is doing well and has no concerns or questions at this time.  *Orders placed for MMG and Colonoscopy

## 2023-07-09 ENCOUNTER — Other Ambulatory Visit: Payer: Medicare PPO

## 2023-07-12 DIAGNOSIS — J3089 Other allergic rhinitis: Secondary | ICD-10-CM | POA: Diagnosis not present

## 2023-07-12 DIAGNOSIS — J301 Allergic rhinitis due to pollen: Secondary | ICD-10-CM | POA: Diagnosis not present

## 2023-07-12 DIAGNOSIS — J3081 Allergic rhinitis due to animal (cat) (dog) hair and dander: Secondary | ICD-10-CM | POA: Diagnosis not present

## 2023-07-16 ENCOUNTER — Ambulatory Visit (INDEPENDENT_AMBULATORY_CARE_PROVIDER_SITE_OTHER): Payer: Medicare PPO | Admitting: Family

## 2023-07-16 ENCOUNTER — Encounter: Payer: Self-pay | Admitting: Family

## 2023-07-16 VITALS — BP 136/82 | HR 75 | Temp 97.5°F | Ht 65.0 in | Wt 203.4 lb

## 2023-07-16 DIAGNOSIS — M810 Age-related osteoporosis without current pathological fracture: Secondary | ICD-10-CM

## 2023-07-16 DIAGNOSIS — Z79899 Other long term (current) drug therapy: Secondary | ICD-10-CM | POA: Insufficient documentation

## 2023-07-16 DIAGNOSIS — E782 Mixed hyperlipidemia: Secondary | ICD-10-CM

## 2023-07-16 DIAGNOSIS — R053 Chronic cough: Secondary | ICD-10-CM

## 2023-07-16 DIAGNOSIS — Z0000 Encounter for general adult medical examination without abnormal findings: Secondary | ICD-10-CM | POA: Insufficient documentation

## 2023-07-16 DIAGNOSIS — J4489 Other specified chronic obstructive pulmonary disease: Secondary | ICD-10-CM | POA: Diagnosis not present

## 2023-07-16 DIAGNOSIS — R739 Hyperglycemia, unspecified: Secondary | ICD-10-CM | POA: Diagnosis not present

## 2023-07-16 LAB — COMPREHENSIVE METABOLIC PANEL
ALT: 24 U/L (ref 0–35)
AST: 16 U/L (ref 0–37)
Albumin: 4.5 g/dL (ref 3.5–5.2)
Alkaline Phosphatase: 79 U/L (ref 39–117)
BUN: 16 mg/dL (ref 6–23)
CO2: 24 meq/L (ref 19–32)
Calcium: 9.5 mg/dL (ref 8.4–10.5)
Chloride: 107 meq/L (ref 96–112)
Creatinine, Ser: 0.73 mg/dL (ref 0.40–1.20)
GFR: 82.26 mL/min (ref >60.00)
Glucose, Bld: 117 mg/dL — ABNORMAL HIGH (ref 70–99)
Potassium: 4.3 meq/L (ref 3.5–5.1)
Sodium: 146 meq/L — ABNORMAL HIGH (ref 135–145)
Total Bilirubin: 0.5 mg/dL (ref 0.2–1.2)
Total Protein: 6.9 g/dL (ref 6.0–8.3)

## 2023-07-16 LAB — LIPID PANEL
Cholesterol: 181 mg/dL (ref 0–200)
HDL: 46.3 mg/dL (ref >39.00)
LDL Cholesterol: 110 mg/dL — ABNORMAL HIGH (ref 0–99)
NonHDL: 135.07
Total CHOL/HDL Ratio: 4
Triglycerides: 125 mg/dL (ref 0.0–149.0)
VLDL: 25 mg/dL (ref 0.0–40.0)

## 2023-07-16 LAB — HEMOGLOBIN A1C: Hgb A1c MFr Bld: 6.2 % (ref 4.6–6.5)

## 2023-07-16 LAB — VITAMIN D 25 HYDROXY (VIT D DEFICIENCY, FRACTURES): VITD: 61.54 ng/mL (ref 30.00–100.00)

## 2023-07-16 MED ORDER — ALENDRONATE SODIUM 70 MG PO TABS: 70.0000 mg | ORAL_TABLET | ORAL | 1 refills | Status: AC

## 2023-07-16 MED ORDER — FLUTICASONE-SALMETEROL 500-50 MCG/ACT IN AEPB: 1.0000 | INHALATION_SPRAY | Freq: Two times a day (BID) | RESPIRATORY_TRACT | Status: DC

## 2023-07-16 NOTE — Assessment & Plan Note (Signed)
Pt advised of the following: Work on a diabetic diet, try to incorporate exercise at least 20-30 a day for 3 days a week or more.   

## 2023-07-16 NOTE — Progress Notes (Signed)
Subjective:  Patient ID: Melinda Tran, female    DOB: 12/15/1950  Age: 72 y.o. MRN: [id number]  Patient Care Team: Mort Sawyers, FNP as PCP - General (Family Medicine) Sidney Ace, MD as Referring Physician (Allergy) [facility], [place]   CC:  Chief Complaint  Patient presents with   Annual Exam    HPI Melinda Tran is a 72 y.o. female who presents today for an annual physical exam. She reports consuming a general diet.  Yoga a few times a week, plan is to walk during the week as well  She generally feels well. She reports sleeping well. She does not have additional problems to discuss today.   Vision:Within last year Dental:Receives regular dental care   Mammogram: 2022   Colonoscopy: 2016, supposed to be every five years however did not schedule. States will call to get scheduled.  Bone density scan: osteoporosis 10/08/19  Pt is without acute concerns.   Pt with dermatology, overdue for appt for multiple nevi, states she will make an appt.   Advanced Directives Patient does have advanced directives. She does not have a copy in the electronic medical record.   DEPRESSION SCREENING    06/26/2023   10:00 AM 04/24/2022   10:29 AM 04/24/2022    9:22 AM 04/20/2021    8:51 AM 07/09/2019   11:24 AM 06/25/2017    8:55 AM 11/25/2015    8:54 AM  PHQ 2/9 Scores  PHQ - 2 Score 0 0 0 0 0 0 0     ROS: Negative unless specifically indicated above in HPI.    Current Outpatient Medications:    albuterol (VENTOLIN HFA) 108 (90 Base) MCG/ACT inhaler, Inhale 2 puffs into the lungs as needed., Disp: , Rfl:    alendronate (FOSAMAX) 70 MG tablet, Take 1 tablet (70 mg total) by mouth every 7 (seven) days. Take with a full glass of water on an empty stomach., Disp: 12 tablet, Rfl: 1   atorvastatin (LIPITOR) 40 MG tablet, Take 1 tablet (40 mg total) by mouth daily., Disp: 90 tablet, Rfl: 3   calcium carbonate 200 MG capsule, Take 250 mg by mouth daily., Disp: , Rfl:     cetirizine (ZYRTEC) 10 MG tablet, Take 10 mg by mouth daily., Disp: , Rfl:    cholecalciferol (VITAMIN D) 1000 UNITS tablet, Take 1,000 Units by mouth daily., Disp: , Rfl:    EPIPEN 2-PAK 0.3 MG/0.3ML SOAJ injection, See admin instructions., Disp: , Rfl:    esomeprazole (NEXIUM) 20 MG capsule, Take 20 mg by mouth daily at 12 noon., Disp: , Rfl:    fluticasone (FLONASE) 50 MCG/ACT nasal spray, 1-2 sprays, Disp: , Rfl:    fluticasone-salmeterol (WIXELA INHUB) 500-50 MCG/ACT AEPB, Inhale 1 puff into the lungs in the morning and at bedtime., Disp: , Rfl:    Glucosamine-Chondroitin (GLUCOSAMINE CHONDR COMPLEX PO), Take 2 tablets by mouth daily., Disp: , Rfl:    montelukast (SINGULAIR) 10 MG tablet, Take 10 mg by mouth at bedtime., Disp: , Rfl:    Spacer/Aero-Holding Chambers (AEROCHAMBER PLUS FLO-VU MEDIUM) MISC, See admin instructions., Disp: , Rfl:    Tiotropium Bromide Monohydrate (SPIRIVA RESPIMAT) 2.5 MCG/ACT AERS, Inhale 2 puffs into the lungs daily., Disp: , Rfl:    Turmeric 500 MG CAPS, Take by mouth daily., Disp: , Rfl:    vitamin C (ASCORBIC ACID) 500 MG tablet, Take 500 mg by mouth daily., Disp: , Rfl:     Objective:    BP 136/82 (  BP Location: Left Arm, Patient Position: Sitting, Cuff Size: Normal)   Pulse 75   Temp (!) 97.5 F (36.4 C) (Temporal)   Ht 5\' 5"  (1.651 m)   Wt 203 lb 6.4 oz (92.3 kg)   SpO2 97%   BMI 33.85 kg/m   BP Readings from Last 3 Encounters:  07/16/23 136/82  01/16/23 132/80  04/24/22 130/80      Physical Exam Constitutional:      General: She is not in acute distress.    Appearance: Normal appearance. She is obese. She is not ill-appearing.  HENT:     Head: Normocephalic.     Right Ear: Tympanic membrane normal.     Left Ear: Tympanic membrane normal.     Nose: Nose normal.     Mouth/Throat:     Mouth: Mucous membranes are moist.  Eyes:     Extraocular Movements: Extraocular movements intact.     Pupils: Pupils are equal, round, and reactive to  light.  Cardiovascular:     Rate and Rhythm: Normal rate and regular rhythm.  Pulmonary:     Effort: Pulmonary effort is normal.     Breath sounds: Normal breath sounds.  Abdominal:     General: Abdomen is flat. Bowel sounds are normal.     Palpations: Abdomen is soft.     Tenderness: There is no guarding or rebound.  Musculoskeletal:        General: Normal range of motion.     Cervical back: Normal range of motion.  Skin:    General: Skin is warm.     Capillary Refill: Capillary refill takes less than 2 seconds.  Neurological:     General: No focal deficit present.     Mental Status: She is alert.  Psychiatric:        Mood and Affect: Mood normal.        Behavior: Behavior normal.        Thought Content: Thought content normal.        Judgment: Judgment normal.          Assessment & Plan:  Chronic cough -     Fluticasone-Salmeterol; Inhale 1 puff into the lungs in the morning and at bedtime.  On statin therapy  Encounter for general adult medical examination without abnormal findings Assessment & Plan: Patient Counseling(The following topics were reviewed):  Preventative care handout given to pt  Health maintenance and immunizations reviewed. Please refer to Health maintenance section. Pt advised on safe sex, wearing seatbelts in car, and proper nutrition labwork ordered today for annual Dental health: Discussed importance of regular tooth brushing, flossing, and dental visits.   Orders: -     Lipid panel -     VITAMIN D 25 Hydroxy (Vit-D Deficiency, Fractures) -     Comprehensive metabolic panel -     Hemoglobin A1c  Hyperglycemia Assessment & Plan: Pt advised of the following: Work on a diabetic diet, try to incorporate exercise at least 20-30 a day for 3 days a week or more.    Orders: -     Hemoglobin A1c  Mixed hyperlipidemia Assessment & Plan: Continue atorvastatin 40 mg nightly  Work on low cholesterol diet and exercise as tolerated Ordering  lipid panel pending results. Goal LDL < 70   Orders: -     Lipid panel  Age-related osteoporosis without current pathological fracture Assessment & Plan: Pt ok with starting fosamax.  S/e and proper administration reviewed. Rx sent to pharmacy.  Cont  calcium vitamin D  Repeat dexa 07/16/23 to monitor    Orders: -     Alendronate Sodium; Take 1 tablet (70 mg total) by mouth every 7 (seven) days. Take with a full glass of water on an empty stomach.  Dispense: 12 tablet; Refill: 1  Asthmatic bronchitis , chronic (HCC) Assessment & Plan: Followed by pulmonary.  Compliant with medications continue as prescribed.       Follow-up: Return in about 6 months (around 01/14/2024) for f/u cholesterol.   Mort Sawyers, FNP

## 2023-07-16 NOTE — Assessment & Plan Note (Signed)

## 2023-07-16 NOTE — Patient Instructions (Signed)
  Please call to schedule colonoscopy , overdue.   We will repeat bone density in one year from last, we will have you start the fosamax for osteoporosis.    Regards,   Mort Sawyers FNP-C

## 2023-07-16 NOTE — Assessment & Plan Note (Signed)
Followed by pulmonary.  Compliant with medications continue as prescribed.

## 2023-07-16 NOTE — Assessment & Plan Note (Signed)
Pt ok with starting fosamax.  S/e and proper administration reviewed. Rx sent to pharmacy.  Cont calcium vitamin D  Repeat dexa 07/16/23 to monitor

## 2023-07-16 NOTE — Assessment & Plan Note (Signed)
Continue atorvastatin 40 mg nightly  Work on low cholesterol diet and exercise as tolerated Ordering lipid panel pending results. Goal LDL < 70

## 2023-07-17 ENCOUNTER — Other Ambulatory Visit: Payer: Self-pay | Admitting: Family

## 2023-07-17 DIAGNOSIS — E782 Mixed hyperlipidemia: Secondary | ICD-10-CM

## 2023-07-17 DIAGNOSIS — R7303 Prediabetes: Secondary | ICD-10-CM | POA: Insufficient documentation

## 2023-07-17 MED ORDER — ATORVASTATIN CALCIUM 80 MG PO TABS: 80.0000 mg | ORAL_TABLET | Freq: Every day | ORAL | 3 refills | Status: DC

## 2023-07-20 DIAGNOSIS — J301 Allergic rhinitis due to pollen: Secondary | ICD-10-CM | POA: Diagnosis not present

## 2023-07-20 DIAGNOSIS — J3089 Other allergic rhinitis: Secondary | ICD-10-CM | POA: Diagnosis not present

## 2023-07-27 DIAGNOSIS — J301 Allergic rhinitis due to pollen: Secondary | ICD-10-CM | POA: Diagnosis not present

## 2023-07-27 DIAGNOSIS — J3089 Other allergic rhinitis: Secondary | ICD-10-CM | POA: Diagnosis not present

## 2023-08-03 DIAGNOSIS — J3089 Other allergic rhinitis: Secondary | ICD-10-CM | POA: Diagnosis not present

## 2023-08-03 DIAGNOSIS — J3081 Allergic rhinitis due to animal (cat) (dog) hair and dander: Secondary | ICD-10-CM | POA: Diagnosis not present

## 2023-08-03 DIAGNOSIS — J301 Allergic rhinitis due to pollen: Secondary | ICD-10-CM | POA: Diagnosis not present

## 2023-08-10 DIAGNOSIS — J3089 Other allergic rhinitis: Secondary | ICD-10-CM | POA: Diagnosis not present

## 2023-08-10 DIAGNOSIS — J301 Allergic rhinitis due to pollen: Secondary | ICD-10-CM | POA: Diagnosis not present

## 2023-08-17 DIAGNOSIS — J301 Allergic rhinitis due to pollen: Secondary | ICD-10-CM | POA: Diagnosis not present

## 2023-08-17 DIAGNOSIS — J3089 Other allergic rhinitis: Secondary | ICD-10-CM | POA: Diagnosis not present

## 2023-08-24 DIAGNOSIS — J301 Allergic rhinitis due to pollen: Secondary | ICD-10-CM | POA: Diagnosis not present

## 2023-08-24 DIAGNOSIS — J3089 Other allergic rhinitis: Secondary | ICD-10-CM | POA: Diagnosis not present

## 2023-08-31 DIAGNOSIS — J3081 Allergic rhinitis due to animal (cat) (dog) hair and dander: Secondary | ICD-10-CM | POA: Diagnosis not present

## 2023-08-31 DIAGNOSIS — J3089 Other allergic rhinitis: Secondary | ICD-10-CM | POA: Diagnosis not present

## 2023-08-31 DIAGNOSIS — J301 Allergic rhinitis due to pollen: Secondary | ICD-10-CM | POA: Diagnosis not present

## 2023-09-07 DIAGNOSIS — J301 Allergic rhinitis due to pollen: Secondary | ICD-10-CM | POA: Diagnosis not present

## 2023-09-07 DIAGNOSIS — J3089 Other allergic rhinitis: Secondary | ICD-10-CM | POA: Diagnosis not present

## 2023-09-07 DIAGNOSIS — J3081 Allergic rhinitis due to animal (cat) (dog) hair and dander: Secondary | ICD-10-CM | POA: Diagnosis not present

## 2023-09-13 DIAGNOSIS — J301 Allergic rhinitis due to pollen: Secondary | ICD-10-CM | POA: Diagnosis not present

## 2023-09-13 DIAGNOSIS — J3081 Allergic rhinitis due to animal (cat) (dog) hair and dander: Secondary | ICD-10-CM | POA: Diagnosis not present

## 2023-09-13 DIAGNOSIS — J3089 Other allergic rhinitis: Secondary | ICD-10-CM | POA: Diagnosis not present

## 2023-09-21 DIAGNOSIS — J301 Allergic rhinitis due to pollen: Secondary | ICD-10-CM | POA: Diagnosis not present

## 2023-09-21 DIAGNOSIS — J3081 Allergic rhinitis due to animal (cat) (dog) hair and dander: Secondary | ICD-10-CM | POA: Diagnosis not present

## 2023-09-21 DIAGNOSIS — J3089 Other allergic rhinitis: Secondary | ICD-10-CM | POA: Diagnosis not present

## 2023-10-01 ENCOUNTER — Ambulatory Visit: Payer: Self-pay

## 2023-10-01 ENCOUNTER — Ambulatory Visit
Admission: RE | Admit: 2023-10-01 | Discharge: 2023-10-01 | Disposition: A | Discharge status: Home / Self Care | Payer: Self-pay | Source: Ambulatory Visit | Attending: Family Medicine | Admitting: Family Medicine

## 2023-10-01 VITALS — BP 153/86 | HR 86 | Temp 98.0°F | Resp 18

## 2023-10-01 DIAGNOSIS — J4521 Mild intermittent asthma with (acute) exacerbation: Secondary | ICD-10-CM | POA: Diagnosis not present

## 2023-10-01 DIAGNOSIS — J22 Unspecified acute lower respiratory infection: Secondary | ICD-10-CM

## 2023-10-01 DIAGNOSIS — R062 Wheezing: Secondary | ICD-10-CM

## 2023-10-01 MED ORDER — PROMETHAZINE-DM 6.25-15 MG/5ML PO SYRP: 5.0000 mL | ORAL_SOLUTION | Freq: Every evening | ORAL | 0 refills | Status: DC | PRN

## 2023-10-01 MED ORDER — ALBUTEROL SULFATE HFA 108 (90 BASE) MCG/ACT IN AERS: 1.0000 | INHALATION_SPRAY | Freq: Four times a day (QID) | RESPIRATORY_TRACT | 0 refills | Status: AC | PRN

## 2023-10-01 MED ORDER — AZITHROMYCIN 250 MG PO TABS
250.0000 mg | ORAL_TABLET | Freq: Every day | ORAL | 0 refills | Status: DC
Start: 2023-10-01 — End: 2024-02-09

## 2023-10-01 MED ORDER — SPIRIVA RESPIMAT 2.5 MCG/ACT IN AERS: 2.0000 | INHALATION_SPRAY | Freq: Every day | RESPIRATORY_TRACT | 1 refills | Status: DC

## 2023-10-01 MED ORDER — IPRATROPIUM-ALBUTEROL 0.5-2.5 (3) MG/3ML IN SOLN
3.0000 mL | Freq: Once | RESPIRATORY_TRACT | Status: AC
  Administered 2023-10-01: 3 mL via RESPIRATORY_TRACT

## 2023-10-01 MED ORDER — DOXYCYCLINE HYCLATE 100 MG PO CAPS
100.0000 mg | ORAL_CAPSULE | Freq: Two times a day (BID) | ORAL | 0 refills | Status: AC
Start: 2023-10-01 — End: 2023-10-08

## 2023-10-01 MED ORDER — ALBUTEROL SULFATE (2.5 MG/3ML) 0.083% IN NEBU: 2.5000 mg | INHALATION_SOLUTION | Freq: Four times a day (QID) | RESPIRATORY_TRACT | 0 refills | Status: DC | PRN

## 2023-10-01 MED ORDER — PREDNISONE 20 MG PO TABS: 20.0000 mg | ORAL_TABLET | Freq: Every day | ORAL | 0 refills | Status: AC

## 2023-10-01 NOTE — ED Triage Notes (Signed)
 Patient presents to UC for cough,  wheezing x 2 months. Right ear pain and "crunching sound" x 2 weeks. Hx of vertigo and asthma. Using her inhalers for her asthma, needs a refill on spiriva and albuterol inhalers. Concerned with bronchitis.

## 2023-10-01 NOTE — Discharge Instructions (Addendum)
 I refilled your albuterol inhaler and Spiriva.  I also sent you albuterol nebulizer solution for your home nebulizer to use as needed. Start doxycycline and Zithromax to treat for potential pneumonia.  Promethazine DM as needed for your cough.  Please only take this at night and note this can make you drowsy so do not drink alcohol or drive on this medication.  Prednisone daily for 5 days.  Lots of fluids and rest.  Please follow-up with your PCP in 2 to 3 days for recheck.  Please go to the ER for any worsening symptoms.  Hope you feel better soon!

## 2023-10-01 NOTE — ED Provider Notes (Signed)
 [facility]    CSN: [id number] Arrival date & time: 10/01/23  1257      History   Chief Complaint Chief Complaint  Patient presents with   Wheezing    I have asthma. For the last month or two I have been wheezing. It started out being sporadic  and mostly at night. Now I'm wheezing day and night with a different cough than my usual asthma cough. I haven't had any temperature. - Entered by patient    HPI RUMI KOLODZIEJ is a 73 y.o. female  presents for evaluation of URI symptoms for 3 weeks. Patient reports associated symptoms of cough, congestion, wheezing, shortness of breath, right ear pain, throat irritation.  Patient states she has had an intermittent cough for 2 months that seem to have worsened over the past 3 weeks.  Denies N/V/D, fevers, body aches. Patient does have a hx of asthma.  Is out of her inhalers and needs refills but has been using them with temporary improvement.  Patient is not an active smoker.   Reports no sick contacts.  Pt has taken nothing OTC for symptoms. Pt has no other concerns at this time.    Wheezing Associated symptoms: cough, shortness of breath and sore throat     Past Medical History:  Diagnosis Date   Allergy    Anemia    in the past    Asthma    GERD (gastroesophageal reflux disease)    Hyperlipidemia     Patient Active Problem List   Diagnosis Date Noted   Prediabetes 07/17/2023   On statin therapy 07/16/2023   Hyperglycemia 07/16/2023   History of colon polyps 01/16/2023   Senile purpura (HCC) 04/24/2022   Osteoporosis 04/24/2022   Asthmatic bronchitis , chronic (HCC) 04/29/2018   GERD (gastroesophageal reflux disease) 05/27/2015   HLD (hyperlipidemia) 06/11/2014   Vertigo 04/16/2014   Family history of colon cancer in mother 04/16/2014    Past Surgical History:  Procedure Laterality Date   COLONOSCOPY     partial hysterectom     still with bil ovaries   POLYPECTOMY      OB History   No obstetric history  on file.      Home Medications    Prior to Admission medications   Medication Sig Start Date End Date Taking? Authorizing Provider  albuterol (PROVENTIL) (2.5 MG/3ML) 0.083% nebulizer solution Take 3 mLs (2.5 mg total) by nebulization every 6 (six) hours as needed for wheezing or shortness of breath. 10/01/23  Yes Radford Pax, NP  albuterol (VENTOLIN HFA) 108 (90 Base) MCG/ACT inhaler Inhale 1-2 puffs into the lungs every 6 (six) hours as needed for wheezing or shortness of breath. 10/01/23  Yes Radford Pax, NP  azithromycin (ZITHROMAX) 250 MG tablet Take 1 tablet (250 mg total) by mouth daily. Take first 2 tablets together, then 1 every day until finished. 10/01/23  Yes Radford Pax, NP  BOOSTRIX 5-2.5-18.5 LF-MCG/0.5 injection Inject 0.5 mLs into the muscle once. 07/17/23  Yes [provider]  doxycycline (VIBRAMYCIN) 100 MG capsule Take 1 capsule (100 mg total) by mouth 2 (two) times daily for 7 days. 10/01/23 10/08/23 Yes Radford Pax, NP  FLUAD 0.5 ML injection Inject 0.5 mLs into the muscle once. 05/22/23  Yes [provider]  predniSONE (DELTASONE) 20 MG tablet Take 1 tablet (20 mg total) by mouth daily with breakfast for 5 days. 10/01/23 10/06/23 Yes Radford Pax, NP  promethazine-dextromethorphan (PROMETHAZINE-DM) 6.25-15 MG/5ML  syrup Take 5 mLs by mouth at bedtime as needed for cough. 10/01/23  Yes Radford Pax, NP  [facility] syringe Inject 0.5 mLs into the muscle once. 07/02/23  Yes [provider]  alendronate (FOSAMAX) 70 MG tablet Take 1 tablet (70 mg total) by mouth every 7 (seven) days. Take with a full glass of water on an empty stomach. 07/16/23   Mort Sawyers, FNP  atorvastatin (LIPITOR) 80 MG tablet Take 1 tablet (80 mg total) by mouth daily. 07/17/23   Mort Sawyers, FNP  calcium carbonate 200 MG capsule Take 250 mg by mouth daily.    [provider]  cetirizine (ZYRTEC) 10 MG tablet Take 10 mg by mouth daily.    [provider]   cholecalciferol (VITAMIN D) 1000 UNITS tablet Take 1,000 Units by mouth daily.    [provider]  EPIPEN 2-PAK 0.3 MG/0.3ML SOAJ injection See admin instructions. 04/08/19   [provider]  esomeprazole (NEXIUM) 20 MG capsule Take 20 mg by mouth daily at 12 noon.    [provider]  fluticasone (FLONASE) 50 MCG/ACT nasal spray 1-2 sprays    [provider]  fluticasone-salmeterol (WIXELA INHUB) 500-50 MCG/ACT AEPB Inhale 1 puff into the lungs in the morning and at bedtime. 07/16/23   Mort Sawyers, FNP  Glucosamine-Chondroitin (GLUCOSAMINE CHONDR COMPLEX PO) Take 2 tablets by mouth daily.    [provider]  montelukast (SINGULAIR) 10 MG tablet Take 10 mg by mouth at bedtime.    [provider]  Spacer/Aero-Holding Chambers (AEROCHAMBER PLUS FLO-VU MEDIUM) MISC See admin instructions.    [provider]  Tiotropium Bromide Monohydrate (SPIRIVA RESPIMAT) 2.5 MCG/ACT AERS Inhale 2 puffs into the lungs daily. 10/01/23   Radford Pax, NP  Turmeric 500 MG CAPS Take by mouth daily.    [provider]  vitamin C (ASCORBIC ACID) 500 MG tablet Take 500 mg by mouth daily.    [provider]    Family History Family History  Problem Relation Age of Onset   Colon cancer Mother    Lung cancer Father    Prostate cancer Father    Alcohol abuse Father    Drug abuse Brother        in recovery   Alcohol abuse Brother        in recovery   Stroke Maternal Grandmother    Heart attack Maternal Grandfather        early 61's   Testicular cancer Son    Rectal cancer Neg Hx    Stomach cancer Neg Hx     Social History Social History   Tobacco Use   Smoking status: Former    Current packs/day: 0.00    Average packs/day: 0.3 packs/day for 5.0 years (1.3 ttl pk-yrs)    Types: Cigarettes    Start date: 2    Quit date: 67    Years since quitting: 48.1   Smokeless tobacco: Never  Vaping Use   Vaping status: Never Used   Substance Use Topics   Alcohol use: Yes    Alcohol/week: 7.0 standard drinks of alcohol    Types: 7 Standard drinks or equivalent per week    Comment: 1 glass of wine a day    Drug use: No     Allergies   Dust mite mixed allergen ext [mite (d. farinae)], Mixed grasses, and Pollen extract-tree extract [pollen extract]   Review of Systems Review of Systems  HENT:  Positive for congestion and sore  throat.   Respiratory:  Positive for cough, shortness of breath and wheezing.      Physical Exam Triage Vital Signs ED Triage Vitals  Encounter Vitals Group     BP 10/01/23 1333 (!) 153/86     Systolic BP Percentile --      Diastolic BP Percentile --      Pulse Rate 10/01/23 1333 85     Resp 10/01/23 1333 18     Temp 10/01/23 1333 98 F (36.7 C)     Temp Source 10/01/23 1333 Oral     SpO2 10/01/23 1333 94 %     Weight --      Height --      Head Circumference --      Peak Flow --      Pain Score 10/01/23 1332 0     Pain Loc --      Pain Education --      Exclude from Growth Chart --    No data found.  Updated Vital Signs BP (!) 153/86 (BP Location: Left Arm)   Pulse 86   Temp 98 F (36.7 C) (Oral)   Resp 18   SpO2 91% Comment: post-neb  Visual Acuity Right Eye Distance:   Left Eye Distance:   Bilateral Distance:    Right Eye Near:   Left Eye Near:    Bilateral Near:     Physical Exam Vitals and nursing note reviewed.  Constitutional:      General: She is not in acute distress.    Appearance: She is well-developed. She is not ill-appearing.  HENT:     Head: Normocephalic and atraumatic.     Right Ear: Tympanic membrane and ear canal normal.     Left Ear: Tympanic membrane and ear canal normal.     Nose: Congestion present.     Mouth/Throat:     Mouth: Mucous membranes are moist.     Pharynx: Oropharynx is clear. Uvula midline. No oropharyngeal exudate or posterior oropharyngeal erythema.     Tonsils: No tonsillar exudate or tonsillar abscesses.  Eyes:      Conjunctiva/sclera: Conjunctivae normal.     Pupils: Pupils are equal, round, and reactive to light.  Cardiovascular:     Rate and Rhythm: Normal rate and regular rhythm.     Heart sounds: Normal heart sounds.  Pulmonary:     Effort: Pulmonary effort is normal.     Breath sounds: Examination of the left-lower field reveals rhonchi. Wheezing and rhonchi present.  Musculoskeletal:     Cervical back: Normal range of motion and neck supple.  Lymphadenopathy:     Cervical: No cervical adenopathy.  Skin:    General: Skin is warm and dry.  Neurological:     General: No focal deficit present.     Mental Status: She is alert and oriented to person, place, and time.  Psychiatric:        Mood and Affect: Mood normal.        Behavior: Behavior normal.      UC Treatments / Results  Labs (all labs ordered are listed, but only abnormal results are displayed) Labs Reviewed - No data to display  Comprehensive metabolic panel Order: [id number]  Status: Final result     Next appt: 06/26/2024 at 01:00 PM in Family Medicine ([facility] ANNUAL WELLNESS VISIT 1)     Dx: Encounter for general adult medical e...   Test Result Released: Yes (seen)     Messages: Seen  0 Result Notes     1 Patient Communication          Component Ref Range & Units (hover) 2 mo ago (07/16/23) 1 yr ago (04/24/22) 2 yr ago (04/20/21) 3 yr ago (11/19/19) 5 yr ago (04/29/18) 6 yr ago (06/25/17) 6 yr ago (12/28/16)  Sodium 146 High  141 141 141 140 140 139  Potassium 4.3 4.5 3.9 3.9 4.0 4.5 4.0  Chloride 107 105 104 104 105 103 105  CO2 24 27 28 30 27 31 30   Glucose, Bld 117 High  102 High  105 High  84 111 High  117 High  93  BUN 16 13 11 13 10 14 13   Creatinine, Ser 0.73 0.79 0.66 0.77 0.77 0.69 0.77  Total Bilirubin 0.5 0.5 0.6 0.7 0.6 0.8 0.4  Alkaline Phosphatase 79 88 85 76 79 89 73  AST 16 14 11 13 15 13 18   ALT 24 17 16 18 20 16 27   Total Protein 6.9 7.2 6.7 6.7 7.3 7.2 6.7  Albumin 4.5 4.3 4.3 4.5  4.4 4.4 4.2  GFR 82.26 75.47 CM 89.13 CM 74.42 79.46 90.42 79.79  Comment: Calculated using the CKD-EPI Creatinine Equation (2021)  Calcium 9.5 9.7 9.6 9.6 9.7 9.9 9.7  Resulting Agency [place] [facility] [place] [facility] [place] [facility] [place] [facility] [place] [facility] [place] [facility] [place] [facility]        Specimen Collected: 07/16/23 08:35    EKG   Radiology No results found.  Procedures Procedures (including critical care time)  Medications Ordered in UC Medications  ipratropium-albuterol (DUONEB) 0.5-2.5 (3) MG/3ML nebulizer solution 3 mL (3 mLs Nebulization Given 10/01/23 1355)    Initial Impression / Assessment and Plan / UC Course  I have reviewed the triage vital signs and the nursing notes.  Pertinent labs & imaging results that were available during my care of the patient were reviewed by me and considered in my medical decision making (see chart for details).  Clinical Course as of 10/01/23 1409  Mon Oct 01, 2023  1406 O2 recheck 95% after nebulizer [JM]    Clinical Course User Index [JM] Radford Pax, NP    Reviewed exam and symptoms with patient.  No red flags.  Patient reports improvement in symptoms after nebulizer.  No x-ray onsite at time of evaluation but based on exam will cover for pneumonia with doxycycline and Zithromax.  Refilled patient's inhaler, Spiriva, and nebulizer solution.  Will do Promethazine DM nightly as needed, side effect profile reviewed.  Will do prednisone daily.  Patient requested lower dose due to her osteoporosis swelling to 20 mg to 40.  Encouraged rest and fluids.  PCP follow-up 2 to 3 days for recheck.  ER precautions reviewed and patient verbalized understanding. Final Clinical Impressions(s) / UC Diagnoses   Final diagnoses:  Wheezing  Mild intermittent asthma with acute exacerbation  Lower respiratory infection (e.g., bronchitis, pneumonia, pneumonitis, pulmonitis)     Discharge Instructions      I refilled your  albuterol inhaler and Spiriva.  I also sent you albuterol nebulizer solution for your home nebulizer to use as needed. Start doxycycline and Zithromax to treat for potential pneumonia.  Promethazine DM as needed for your cough.  Please only take this at night and note this can make you drowsy so do not drink alcohol or drive on this medication.  Prednisone daily for 5 days.  Lots of fluids and rest.  Please follow-up with your PCP in 2 to 3 days  for recheck.  Please go to the ER for any worsening symptoms.  Hope you feel better soon!     ED Prescriptions     Medication Sig Dispense Auth. Provider   Tiotropium Bromide Monohydrate (SPIRIVA RESPIMAT) 2.5 MCG/ACT AERS Inhale 2 puffs into the lungs daily. 4 g Radford Pax, NP   albuterol (VENTOLIN HFA) 108 (90 Base) MCG/ACT inhaler Inhale 1-2 puffs into the lungs every 6 (six) hours as needed for wheezing or shortness of breath. 1 each Radford Pax, NP   albuterol (PROVENTIL) (2.5 MG/3ML) 0.083% nebulizer solution Take 3 mLs (2.5 mg total) by nebulization every 6 (six) hours as needed for wheezing or shortness of breath. 75 mL Radford Pax, NP   doxycycline (VIBRAMYCIN) 100 MG capsule Take 1 capsule (100 mg total) by mouth 2 (two) times daily for 7 days. 14 capsule Radford Pax, NP   azithromycin (ZITHROMAX) 250 MG tablet Take 1 tablet (250 mg total) by mouth daily. Take first 2 tablets together, then 1 every day until finished. 6 tablet Radford Pax, NP   predniSONE (DELTASONE) 20 MG tablet Take 1 tablet (20 mg total) by mouth daily with breakfast for 5 days. 5 tablet Radford Pax, NP   promethazine-dextromethorphan (PROMETHAZINE-DM) 6.25-15 MG/5ML syrup Take 5 mLs by mouth at bedtime as needed for cough. 118 mL Radford Pax, NP      PDMP not reviewed this encounter.   Radford Pax, NP 10/01/23 1409

## 2023-10-05 DIAGNOSIS — J3081 Allergic rhinitis due to animal (cat) (dog) hair and dander: Secondary | ICD-10-CM | POA: Diagnosis not present

## 2023-10-05 DIAGNOSIS — J301 Allergic rhinitis due to pollen: Secondary | ICD-10-CM | POA: Diagnosis not present

## 2023-10-05 DIAGNOSIS — J3089 Other allergic rhinitis: Secondary | ICD-10-CM | POA: Diagnosis not present

## 2023-10-12 DIAGNOSIS — J3089 Other allergic rhinitis: Secondary | ICD-10-CM | POA: Diagnosis not present

## 2023-10-12 DIAGNOSIS — J3081 Allergic rhinitis due to animal (cat) (dog) hair and dander: Secondary | ICD-10-CM | POA: Diagnosis not present

## 2023-10-12 DIAGNOSIS — J301 Allergic rhinitis due to pollen: Secondary | ICD-10-CM | POA: Diagnosis not present

## 2023-10-19 DIAGNOSIS — J3089 Other allergic rhinitis: Secondary | ICD-10-CM | POA: Diagnosis not present

## 2023-10-19 DIAGNOSIS — J3081 Allergic rhinitis due to animal (cat) (dog) hair and dander: Secondary | ICD-10-CM | POA: Diagnosis not present

## 2023-10-19 DIAGNOSIS — J301 Allergic rhinitis due to pollen: Secondary | ICD-10-CM | POA: Diagnosis not present

## 2023-10-26 DIAGNOSIS — J3089 Other allergic rhinitis: Secondary | ICD-10-CM | POA: Diagnosis not present

## 2023-10-26 DIAGNOSIS — J301 Allergic rhinitis due to pollen: Secondary | ICD-10-CM | POA: Diagnosis not present

## 2023-10-26 DIAGNOSIS — J3081 Allergic rhinitis due to animal (cat) (dog) hair and dander: Secondary | ICD-10-CM | POA: Diagnosis not present

## 2023-11-02 DIAGNOSIS — J3089 Other allergic rhinitis: Secondary | ICD-10-CM | POA: Diagnosis not present

## 2023-11-02 DIAGNOSIS — J3081 Allergic rhinitis due to animal (cat) (dog) hair and dander: Secondary | ICD-10-CM | POA: Diagnosis not present

## 2023-11-02 DIAGNOSIS — J301 Allergic rhinitis due to pollen: Secondary | ICD-10-CM | POA: Diagnosis not present

## 2023-11-09 DIAGNOSIS — J301 Allergic rhinitis due to pollen: Secondary | ICD-10-CM | POA: Diagnosis not present

## 2023-11-09 DIAGNOSIS — J3081 Allergic rhinitis due to animal (cat) (dog) hair and dander: Secondary | ICD-10-CM | POA: Diagnosis not present

## 2023-11-09 DIAGNOSIS — J3089 Other allergic rhinitis: Secondary | ICD-10-CM | POA: Diagnosis not present

## 2023-11-16 DIAGNOSIS — J3081 Allergic rhinitis due to animal (cat) (dog) hair and dander: Secondary | ICD-10-CM | POA: Diagnosis not present

## 2023-11-16 DIAGNOSIS — J3089 Other allergic rhinitis: Secondary | ICD-10-CM | POA: Diagnosis not present

## 2023-11-16 DIAGNOSIS — J301 Allergic rhinitis due to pollen: Secondary | ICD-10-CM | POA: Diagnosis not present

## 2023-11-30 DIAGNOSIS — J3081 Allergic rhinitis due to animal (cat) (dog) hair and dander: Secondary | ICD-10-CM | POA: Diagnosis not present

## 2023-11-30 DIAGNOSIS — J3089 Other allergic rhinitis: Secondary | ICD-10-CM | POA: Diagnosis not present

## 2023-11-30 DIAGNOSIS — J301 Allergic rhinitis due to pollen: Secondary | ICD-10-CM | POA: Diagnosis not present

## 2023-12-07 DIAGNOSIS — J301 Allergic rhinitis due to pollen: Secondary | ICD-10-CM | POA: Diagnosis not present

## 2023-12-07 DIAGNOSIS — J3081 Allergic rhinitis due to animal (cat) (dog) hair and dander: Secondary | ICD-10-CM | POA: Diagnosis not present

## 2023-12-07 DIAGNOSIS — J3089 Other allergic rhinitis: Secondary | ICD-10-CM | POA: Diagnosis not present

## 2023-12-14 DIAGNOSIS — J301 Allergic rhinitis due to pollen: Secondary | ICD-10-CM | POA: Diagnosis not present

## 2023-12-14 DIAGNOSIS — J3081 Allergic rhinitis due to animal (cat) (dog) hair and dander: Secondary | ICD-10-CM | POA: Diagnosis not present

## 2023-12-14 DIAGNOSIS — J3089 Other allergic rhinitis: Secondary | ICD-10-CM | POA: Diagnosis not present

## 2023-12-21 DIAGNOSIS — J3081 Allergic rhinitis due to animal (cat) (dog) hair and dander: Secondary | ICD-10-CM | POA: Diagnosis not present

## 2023-12-21 DIAGNOSIS — J301 Allergic rhinitis due to pollen: Secondary | ICD-10-CM | POA: Diagnosis not present

## 2023-12-21 DIAGNOSIS — J3089 Other allergic rhinitis: Secondary | ICD-10-CM | POA: Diagnosis not present

## 2023-12-28 DIAGNOSIS — J301 Allergic rhinitis due to pollen: Secondary | ICD-10-CM | POA: Diagnosis not present

## 2023-12-28 DIAGNOSIS — J3089 Other allergic rhinitis: Secondary | ICD-10-CM | POA: Diagnosis not present

## 2023-12-28 DIAGNOSIS — J3081 Allergic rhinitis due to animal (cat) (dog) hair and dander: Secondary | ICD-10-CM | POA: Diagnosis not present

## 2024-01-04 DIAGNOSIS — J3081 Allergic rhinitis due to animal (cat) (dog) hair and dander: Secondary | ICD-10-CM | POA: Diagnosis not present

## 2024-01-04 DIAGNOSIS — J301 Allergic rhinitis due to pollen: Secondary | ICD-10-CM | POA: Diagnosis not present

## 2024-01-04 DIAGNOSIS — J3089 Other allergic rhinitis: Secondary | ICD-10-CM | POA: Diagnosis not present

## 2024-01-09 DIAGNOSIS — J3089 Other allergic rhinitis: Secondary | ICD-10-CM | POA: Diagnosis not present

## 2024-01-09 DIAGNOSIS — J301 Allergic rhinitis due to pollen: Secondary | ICD-10-CM | POA: Diagnosis not present

## 2024-01-11 DIAGNOSIS — J3089 Other allergic rhinitis: Secondary | ICD-10-CM | POA: Diagnosis not present

## 2024-01-11 DIAGNOSIS — J3081 Allergic rhinitis due to animal (cat) (dog) hair and dander: Secondary | ICD-10-CM | POA: Diagnosis not present

## 2024-01-11 DIAGNOSIS — J301 Allergic rhinitis due to pollen: Secondary | ICD-10-CM | POA: Diagnosis not present

## 2024-01-18 DIAGNOSIS — J3089 Other allergic rhinitis: Secondary | ICD-10-CM | POA: Diagnosis not present

## 2024-01-18 DIAGNOSIS — J301 Allergic rhinitis due to pollen: Secondary | ICD-10-CM | POA: Diagnosis not present

## 2024-01-18 DIAGNOSIS — J3081 Allergic rhinitis due to animal (cat) (dog) hair and dander: Secondary | ICD-10-CM | POA: Diagnosis not present

## 2024-01-25 DIAGNOSIS — J3081 Allergic rhinitis due to animal (cat) (dog) hair and dander: Secondary | ICD-10-CM | POA: Diagnosis not present

## 2024-01-25 DIAGNOSIS — J301 Allergic rhinitis due to pollen: Secondary | ICD-10-CM | POA: Diagnosis not present

## 2024-01-25 DIAGNOSIS — J3089 Other allergic rhinitis: Secondary | ICD-10-CM | POA: Diagnosis not present

## 2024-02-01 DIAGNOSIS — J3089 Other allergic rhinitis: Secondary | ICD-10-CM | POA: Diagnosis not present

## 2024-02-01 DIAGNOSIS — J3081 Allergic rhinitis due to animal (cat) (dog) hair and dander: Secondary | ICD-10-CM | POA: Diagnosis not present

## 2024-02-01 DIAGNOSIS — J301 Allergic rhinitis due to pollen: Secondary | ICD-10-CM | POA: Diagnosis not present

## 2024-02-06 ENCOUNTER — Telehealth: Admitting: Nurse Practitioner

## 2024-02-06 DIAGNOSIS — J4521 Mild intermittent asthma with (acute) exacerbation: Secondary | ICD-10-CM

## 2024-02-06 DIAGNOSIS — J22 Unspecified acute lower respiratory infection: Secondary | ICD-10-CM | POA: Diagnosis not present

## 2024-02-09 MED ORDER — AZITHROMYCIN 250 MG PO TABS: 250.0000 mg | ORAL_TABLET | Freq: Every day | ORAL | 0 refills | Status: DC

## 2024-02-09 MED ORDER — PREDNISONE 20 MG PO TABS: 20.0000 mg | ORAL_TABLET | Freq: Every day | ORAL | 0 refills | Status: AC

## 2024-02-09 MED ORDER — ALBUTEROL SULFATE (2.5 MG/3ML) 0.083% IN NEBU: 2.5000 mg | INHALATION_SOLUTION | Freq: Four times a day (QID) | RESPIRATORY_TRACT | 0 refills | Status: AC | PRN

## 2024-02-09 NOTE — Progress Notes (Signed)
 E-Visit for Cough   We are sorry that you are not feeling well.  Here is how we plan to help!  Based on your presentation I believe you most likely have A cough due to bacteria.  When patients have a fever and a productive cough with a change in color or increased sputum production, we are concerned about bacterial bronchitis.  If left untreated it can progress to pneumonia.  If your symptoms do not improve with your treatment plan it is important that you contact your provider.   I have prescribed Azithromyin 250 mg: two tablets now and then one tablet daily for 4 additonal days    In addition you may use nebulizers which have been sent to the pharmacy and prednisone  20 mg for 3 days. If your symptoms persist please reach out to your PCP.    From your responses in the eVisit questionnaire you describe inflammation in the upper respiratory tract which is causing a significant cough.  This is commonly called Bronchitis and has four common causes:   Allergies Viral Infections Acid Reflux Bacterial Infection Allergies, viruses and acid reflux are treated by controlling symptoms or eliminating the cause. An example might be a cough caused by taking certain blood pressure medications. You stop the cough by changing the medication. Another example might be a cough caused by acid reflux. Controlling the reflux helps control the cough.  USE OF BRONCHODILATOR (RESCUE) INHALERS: There is a risk from using your bronchodilator too frequently.  The risk is that over-reliance on a medication which only relaxes the muscles surrounding the breathing tubes can reduce the effectiveness of medications prescribed to reduce swelling and congestion of the tubes themselves.  Although you feel brief relief from the bronchodilator inhaler, your asthma may actually be worsening with the tubes becoming more swollen and filled with mucus.  This can delay other crucial treatments, such as oral steroid medications. If you  need to use a bronchodilator inhaler daily, several times per day, you should discuss this with your provider.  There are probably better treatments that could be used to keep your asthma under control.     HOME CARE Only take medications as instructed by your medical team. Complete the entire course of an antibiotic. Drink plenty of fluids and get plenty of rest. Avoid close contacts especially the very young and the elderly Cover your mouth if you cough or cough into your sleeve. Always remember to wash your hands A steam or ultrasonic humidifier can help congestion.   GET HELP RIGHT AWAY IF: You develop worsening fever. You become short of breath You cough up blood. Your symptoms persist after you have completed your treatment plan MAKE SURE YOU  Understand these instructions. Will watch your condition. Will get help right away if you are not doing well or get worse.    Thank you for choosing an e-visit.  Your e-visit answers were reviewed by a board certified advanced clinical practitioner to complete your personal care plan. Depending upon the condition, your plan could have included both over the counter or prescription medications.  Please review your pharmacy choice. Make sure the pharmacy is open so you can pick up prescription now. If there is a problem, you may contact your provider through [employer] and have the prescription routed to another pharmacy.  Your safety is important to us . If you have drug allergies check your prescription carefully.   For the next 24 hours you can use MyChart to ask questions  about today's visit, request a non-urgent call back, or ask for a work or school excuse. You will get an email in the next two days asking about your experience. I hope that your e-visit has been valuable and will speed your recovery.

## 2024-02-09 NOTE — Progress Notes (Signed)
 I have spent 5 minutes in review of e-visit questionnaire, review and updating patient chart, medical decision making and response to patient.   Claiborne Rigg, NP

## 2024-02-13 DIAGNOSIS — J3089 Other allergic rhinitis: Secondary | ICD-10-CM | POA: Diagnosis not present

## 2024-02-13 DIAGNOSIS — J3081 Allergic rhinitis due to animal (cat) (dog) hair and dander: Secondary | ICD-10-CM | POA: Diagnosis not present

## 2024-02-13 DIAGNOSIS — J301 Allergic rhinitis due to pollen: Secondary | ICD-10-CM | POA: Diagnosis not present

## 2024-02-14 ENCOUNTER — Ambulatory Visit: Admitting: Family

## 2024-02-14 ENCOUNTER — Encounter: Payer: Self-pay | Admitting: Family

## 2024-02-14 VITALS — BP 132/70 | HR 67 | Temp 98.2°F | Ht 65.0 in | Wt 198.0 lb

## 2024-02-14 DIAGNOSIS — J4541 Moderate persistent asthma with (acute) exacerbation: Secondary | ICD-10-CM | POA: Diagnosis not present

## 2024-02-14 MED ORDER — PREDNISONE 20 MG PO TABS: ORAL_TABLET | ORAL | 0 refills | Status: AC

## 2024-02-14 MED ORDER — DOXYCYCLINE HYCLATE 100 MG PO TABS
100.0000 mg | ORAL_TABLET | Freq: Two times a day (BID) | ORAL | 0 refills | Status: AC
Start: 2024-02-14 — End: 2024-02-24

## 2024-02-14 MED ORDER — GUAIFENESIN-CODEINE 100-10 MG/5ML PO SOLN: 5.0000 mL | Freq: Three times a day (TID) | ORAL | 0 refills | Status: AC | PRN

## 2024-02-14 NOTE — Progress Notes (Signed)
 Established Patient Office Visit  Subjective:      CC:  Chief Complaint  Patient presents with   Cough    Had cough in feb and march and was seen at [facility] where she was given medications and cleared up until may. Patient states she got it back and seems to be worse. Cough is productive with cloudy looking mucus. Patient states that she has no other sx with the cough.     HPI: Melinda Tran is a 73 y.o. female presenting on 02/14/2024 for Cough (Had cough in feb and march and was seen at [facility] where she was given medications and cleared up until may. Patient states she got it back and seems to be worse. Cough is productive with cloudy looking mucus. Patient states that she has no other sx with the cough. )  About [place], and she has been coughing since about late may and she doesn't want to cough on the cruise. The cough is throaty and is productive, clear sputum. She does notice when sleeping on her back she does wheeze. She does have asthma. No itchy watery eyes no post nasal drip. No sneezing. She does take daily zyrtec and uses inhalers for her asthma (wixela bid). She has used her nebulizer a few days but there hasn't been much improvement.  She is also on singulair 10 mg once daily. She doesn't feel sick, slight chest congestion no nasal congestion, sinus pressure or sore throat. No ear pain. She is using her nebulizer everyday.   She does use flonase, although not recently.   No worsening pedal edema and or weight changes.   Social history:  Relevant past medical, surgical, family and social history reviewed and updated as indicated. Interim medical history since our last visit reviewed.  Allergies and medications reviewed and updated.  DATA REVIEWED: CHART IN EPIC     ROS: Negative unless specifically indicated above in HPI.    Current Outpatient Medications:    albuterol  (PROVENTIL ) (2.5 MG/3ML) 0.083% nebulizer solution, Take 3 mLs (2.5 mg  total) by nebulization every 6 (six) hours as needed for wheezing or shortness of breath., Disp: 75 mL, Rfl: 0   albuterol  (VENTOLIN  HFA) 108 (90 Base) MCG/ACT inhaler, Inhale 1-2 puffs into the lungs every 6 (six) hours as needed for wheezing or shortness of breath., Disp: 1 each, Rfl: 0   alendronate  (FOSAMAX ) 70 MG tablet, Take 1 tablet (70 mg total) by mouth every 7 (seven) days. Take with a full glass of water on an empty stomach., Disp: 12 tablet, Rfl: 1   atorvastatin  (LIPITOR) 80 MG tablet, Take 1 tablet (80 mg total) by mouth daily., Disp: 90 tablet, Rfl: 3   calcium  carbonate 200 MG capsule, Take 250 mg by mouth daily., Disp: , Rfl:    cetirizine (ZYRTEC) 10 MG tablet, Take 10 mg by mouth daily., Disp: , Rfl:    cholecalciferol (VITAMIN D ) 1000 UNITS tablet, Take 1,000 Units by mouth daily., Disp: , Rfl:    doxycycline  (VIBRA -TABS) 100 MG tablet, Take 1 tablet (100 mg total) by mouth 2 (two) times daily for 10 days., Disp: 20 tablet, Rfl: 0   EPIPEN 2-PAK 0.3 MG/0.3ML SOAJ injection, See admin instructions., Disp: , Rfl:    esomeprazole (NEXIUM) 20 MG capsule, Take 20 mg by mouth daily at 12 noon., Disp: , Rfl:    fluticasone  (FLONASE) 50 MCG/ACT nasal spray, 1-2 sprays, Disp: , Rfl:  fluticasone -salmeterol (WIXELA INHUB) 500-50 MCG/ACT AEPB, Inhale 1 puff into the lungs in the morning and at bedtime., Disp: , Rfl:    Glucosamine-Chondroitin (GLUCOSAMINE CHONDR COMPLEX PO), Take 2 tablets by mouth daily., Disp: , Rfl:    guaiFENesin -codeine  100-10 MG/5ML syrup, Take 5 mLs by mouth 3 (three) times daily as needed for up to 5 days., Disp: 75 mL, Rfl: 0   montelukast (SINGULAIR) 10 MG tablet, Take 10 mg by mouth at bedtime., Disp: , Rfl:    predniSONE  (DELTASONE ) 20 MG tablet, Take two tablets once daily for five days, Disp: 10 tablet, Rfl: 0   Spacer/Aero-Holding Chambers (AEROCHAMBER PLUS FLO-VU MEDIUM) MISC, See admin instructions., Disp: , Rfl:    Turmeric 500 MG CAPS, Take by mouth  daily., Disp: , Rfl:    vitamin C (ASCORBIC ACID) 500 MG tablet, Take 500 mg by mouth daily., Disp: , Rfl:       Objective:    BP 132/70   Pulse 67   Temp 98.2 F (36.8 C) (Temporal)   Ht 5' 5 (1.651 m)   Wt 198 lb (89.8 kg)   SpO2 98%   BMI 32.95 kg/m   Wt Readings from Last 3 Encounters:  02/14/24 198 lb (89.8 kg)  07/16/23 203 lb 6.4 oz (92.3 kg)  06/26/23 199 lb (90.3 kg)    Physical Exam Constitutional:      General: She is not in acute distress.    Appearance: Normal appearance. She is normal weight. She is not ill-appearing, toxic-appearing or diaphoretic.  HENT:     Head: Normocephalic.  Cardiovascular:     Rate and Rhythm: Normal rate and regular rhythm.  Pulmonary:     Effort: Pulmonary effort is normal.     Breath sounds: Normal breath sounds.  Musculoskeletal:        General: Normal range of motion.  Neurological:     General: No focal deficit present.     Mental Status: She is alert and oriented to person, place, and time. Mental status is at baseline.  Psychiatric:        Mood and Affect: Mood normal.        Behavior: Behavior normal.        Thought Content: Thought content normal.        Judgment: Judgment normal.           Assessment & Plan:  Moderate persistent asthma with exacerbation Assessment & Plan: Suspect asthma exacerbation vs bacterial  Sending in prednisone  20 mg two tablets daily x 5 days.  Rx guaif codeine  cough syrup, pdmp reviewed, sedative symptoms discussed as potential.  Sending in doxycycline  100 mg po bid x 10 days but expressed to pt only to take if no improvement with prednisone .  Stop zyrtec start xyzal  Continue regular use of flonase Continue singulair  Orders: -     predniSONE ; Take two tablets once daily for five days  Dispense: 10 tablet; Refill: 0 -     Doxycycline  Hyclate; Take 1 tablet (100 mg total) by mouth 2 (two) times daily for 10 days.  Dispense: 20 tablet; Refill: 0 -     guaiFENesin -Codeine ; Take 5  mLs by mouth 3 (three) times daily as needed for up to 5 days.  Dispense: 75 mL; Refill: 0     Return if symptoms worsen or fail to improve.  Ginger Patrick, MSN, APRN, FNP-C [facility]

## 2024-02-14 NOTE — Patient Instructions (Addendum)
  Start using flonase regularly.  Stop zyrtec start another antihistamine such as xyzal, claritin and or allegra.  Start steroids to see if this helps.

## 2024-02-14 NOTE — Assessment & Plan Note (Signed)
 Suspect asthma exacerbation vs bacterial  Sending in prednisone  20 mg two tablets daily x 5 days.  Rx guaif codeine  cough syrup, pdmp reviewed, sedative symptoms discussed as potential.  Sending in doxycycline  100 mg po bid x 10 days but expressed to pt only to take if no improvement with prednisone .  Stop zyrtec start xyzal  Continue regular use of flonase Continue singulair

## 2024-02-18 DIAGNOSIS — J3089 Other allergic rhinitis: Secondary | ICD-10-CM | POA: Diagnosis not present

## 2024-02-18 DIAGNOSIS — J301 Allergic rhinitis due to pollen: Secondary | ICD-10-CM | POA: Diagnosis not present

## 2024-02-18 DIAGNOSIS — J3081 Allergic rhinitis due to animal (cat) (dog) hair and dander: Secondary | ICD-10-CM | POA: Diagnosis not present

## 2024-03-07 DIAGNOSIS — J301 Allergic rhinitis due to pollen: Secondary | ICD-10-CM | POA: Diagnosis not present

## 2024-03-07 DIAGNOSIS — J3089 Other allergic rhinitis: Secondary | ICD-10-CM | POA: Diagnosis not present

## 2024-03-07 DIAGNOSIS — J3081 Allergic rhinitis due to animal (cat) (dog) hair and dander: Secondary | ICD-10-CM | POA: Diagnosis not present

## 2024-03-14 DIAGNOSIS — J3089 Other allergic rhinitis: Secondary | ICD-10-CM | POA: Diagnosis not present

## 2024-03-14 DIAGNOSIS — J3081 Allergic rhinitis due to animal (cat) (dog) hair and dander: Secondary | ICD-10-CM | POA: Diagnosis not present

## 2024-03-14 DIAGNOSIS — J301 Allergic rhinitis due to pollen: Secondary | ICD-10-CM | POA: Diagnosis not present

## 2024-03-21 DIAGNOSIS — J3081 Allergic rhinitis due to animal (cat) (dog) hair and dander: Secondary | ICD-10-CM | POA: Diagnosis not present

## 2024-03-21 DIAGNOSIS — J301 Allergic rhinitis due to pollen: Secondary | ICD-10-CM | POA: Diagnosis not present

## 2024-03-21 DIAGNOSIS — J3089 Other allergic rhinitis: Secondary | ICD-10-CM | POA: Diagnosis not present

## 2024-03-24 ENCOUNTER — Encounter: Payer: Self-pay | Admitting: *Deleted

## 2024-04-04 DIAGNOSIS — J3089 Other allergic rhinitis: Secondary | ICD-10-CM | POA: Diagnosis not present

## 2024-04-04 DIAGNOSIS — J301 Allergic rhinitis due to pollen: Secondary | ICD-10-CM | POA: Diagnosis not present

## 2024-04-04 DIAGNOSIS — J3081 Allergic rhinitis due to animal (cat) (dog) hair and dander: Secondary | ICD-10-CM | POA: Diagnosis not present

## 2024-04-11 DIAGNOSIS — J301 Allergic rhinitis due to pollen: Secondary | ICD-10-CM | POA: Diagnosis not present

## 2024-04-11 DIAGNOSIS — J3089 Other allergic rhinitis: Secondary | ICD-10-CM | POA: Diagnosis not present

## 2024-04-11 DIAGNOSIS — J3081 Allergic rhinitis due to animal (cat) (dog) hair and dander: Secondary | ICD-10-CM | POA: Diagnosis not present

## 2024-04-15 ENCOUNTER — Telehealth: Payer: Self-pay | Admitting: Family

## 2024-04-15 NOTE — Telephone Encounter (Signed)
 Appointment for: Melinda Tran ([id number])  Visit type: PHYSICAL (8004)  08/20/2024 9:40 AM (20 minutes) with Ginger Patrick in [facility]    Patient comments:  Yearly medicare physical.  I need a prescription to get my flu shot at [facility]. Can I get that before my January 14 th appointment?

## 2024-04-18 DIAGNOSIS — J3081 Allergic rhinitis due to animal (cat) (dog) hair and dander: Secondary | ICD-10-CM | POA: Diagnosis not present

## 2024-04-18 DIAGNOSIS — J3089 Other allergic rhinitis: Secondary | ICD-10-CM | POA: Diagnosis not present

## 2024-04-18 DIAGNOSIS — J301 Allergic rhinitis due to pollen: Secondary | ICD-10-CM | POA: Diagnosis not present

## 2024-04-25 DIAGNOSIS — J3089 Other allergic rhinitis: Secondary | ICD-10-CM | POA: Diagnosis not present

## 2024-04-25 DIAGNOSIS — J3081 Allergic rhinitis due to animal (cat) (dog) hair and dander: Secondary | ICD-10-CM | POA: Diagnosis not present

## 2024-04-25 DIAGNOSIS — J301 Allergic rhinitis due to pollen: Secondary | ICD-10-CM | POA: Diagnosis not present

## 2024-05-02 DIAGNOSIS — J3089 Other allergic rhinitis: Secondary | ICD-10-CM | POA: Diagnosis not present

## 2024-05-02 DIAGNOSIS — J301 Allergic rhinitis due to pollen: Secondary | ICD-10-CM | POA: Diagnosis not present

## 2024-05-02 DIAGNOSIS — J3081 Allergic rhinitis due to animal (cat) (dog) hair and dander: Secondary | ICD-10-CM | POA: Diagnosis not present

## 2024-05-15 DIAGNOSIS — J3081 Allergic rhinitis due to animal (cat) (dog) hair and dander: Secondary | ICD-10-CM | POA: Diagnosis not present

## 2024-05-15 DIAGNOSIS — J301 Allergic rhinitis due to pollen: Secondary | ICD-10-CM | POA: Diagnosis not present

## 2024-05-15 DIAGNOSIS — J3089 Other allergic rhinitis: Secondary | ICD-10-CM | POA: Diagnosis not present

## 2024-05-23 DIAGNOSIS — J3089 Other allergic rhinitis: Secondary | ICD-10-CM | POA: Diagnosis not present

## 2024-05-23 DIAGNOSIS — J3081 Allergic rhinitis due to animal (cat) (dog) hair and dander: Secondary | ICD-10-CM | POA: Diagnosis not present

## 2024-05-23 DIAGNOSIS — J301 Allergic rhinitis due to pollen: Secondary | ICD-10-CM | POA: Diagnosis not present

## 2024-05-30 DIAGNOSIS — J301 Allergic rhinitis due to pollen: Secondary | ICD-10-CM | POA: Diagnosis not present

## 2024-05-30 DIAGNOSIS — J3081 Allergic rhinitis due to animal (cat) (dog) hair and dander: Secondary | ICD-10-CM | POA: Diagnosis not present

## 2024-05-30 DIAGNOSIS — J3089 Other allergic rhinitis: Secondary | ICD-10-CM | POA: Diagnosis not present

## 2024-06-06 DIAGNOSIS — J3089 Other allergic rhinitis: Secondary | ICD-10-CM | POA: Diagnosis not present

## 2024-06-06 DIAGNOSIS — J301 Allergic rhinitis due to pollen: Secondary | ICD-10-CM | POA: Diagnosis not present

## 2024-06-06 DIAGNOSIS — J3081 Allergic rhinitis due to animal (cat) (dog) hair and dander: Secondary | ICD-10-CM | POA: Diagnosis not present

## 2024-06-13 DIAGNOSIS — J3089 Other allergic rhinitis: Secondary | ICD-10-CM | POA: Diagnosis not present

## 2024-06-13 DIAGNOSIS — J301 Allergic rhinitis due to pollen: Secondary | ICD-10-CM | POA: Diagnosis not present

## 2024-06-13 DIAGNOSIS — J3081 Allergic rhinitis due to animal (cat) (dog) hair and dander: Secondary | ICD-10-CM | POA: Diagnosis not present

## 2024-06-20 DIAGNOSIS — J3089 Other allergic rhinitis: Secondary | ICD-10-CM | POA: Diagnosis not present

## 2024-06-20 DIAGNOSIS — J301 Allergic rhinitis due to pollen: Secondary | ICD-10-CM | POA: Diagnosis not present

## 2024-06-24 DIAGNOSIS — J301 Allergic rhinitis due to pollen: Secondary | ICD-10-CM | POA: Diagnosis not present

## 2024-06-24 DIAGNOSIS — J3089 Other allergic rhinitis: Secondary | ICD-10-CM | POA: Diagnosis not present

## 2024-06-24 DIAGNOSIS — K219 Gastro-esophageal reflux disease without esophagitis: Secondary | ICD-10-CM | POA: Diagnosis not present

## 2024-06-24 DIAGNOSIS — J4530 Mild persistent asthma, uncomplicated: Secondary | ICD-10-CM | POA: Diagnosis not present

## 2024-06-26 ENCOUNTER — Ambulatory Visit: Payer: Medicare PPO

## 2024-06-26 VITALS — Ht 65.0 in | Wt 198.0 lb

## 2024-06-26 DIAGNOSIS — Z0000 Encounter for general adult medical examination without abnormal findings: Secondary | ICD-10-CM

## 2024-06-26 DIAGNOSIS — Z1231 Encounter for screening mammogram for malignant neoplasm of breast: Secondary | ICD-10-CM

## 2024-06-26 NOTE — Patient Instructions (Signed)
 Melinda Tran,  Thank you for taking the time for your Medicare Wellness Visit. I appreciate your continued commitment to your health goals. Please review the care plan we discussed, and feel free to reach out if I can assist you further.  Please note that Annual Wellness Visits do not include a physical exam. Some assessments may be limited, especially if the visit was conducted virtually. If needed, we may recommend an in-person follow-up with your provider.  Ongoing Care Seeing your primary care provider every 3 to 6 months helps us  monitor your health and provide consistent, personalized care.   Referrals If a referral was made during today's visit and you haven't received any updates within two weeks, please contact the referred provider directly to check on the status.  Recommended Screenings:  Health Maintenance  Topic Date Due   Colon Cancer Screening  09/24/2019   Breast Cancer Screening  05/28/2023   COVID-19 Vaccine (13 - 2025-26 season) 04/07/2024   Medicare Annual Wellness Visit  06/25/2024   DTaP/Tdap/Td vaccine (2 - Td or Tdap) 07/16/2033   Pneumococcal Vaccine for age over 25  Completed   Flu Shot  Completed   Osteoporosis screening with Bone Density Scan  Completed   Hepatitis C Screening  Completed   Zoster (Shingles) Vaccine  Completed   Meningitis B Vaccine  Aged Out       06/26/2024    1:10 PM  Advanced Directives  Does Patient Have a Medical Advance Directive? Yes  Type of Estate Agent of [place];Living will  Copy of Healthcare Power of Attorney in Chart? No - copy requested    Vision: Annual vision screenings are recommended for early detection of glaucoma, cataracts, and diabetic retinopathy. These exams can also reveal signs of chronic conditions such as diabetes and high blood pressure.  Dental: Annual dental screenings help detect early signs of oral cancer, gum disease, and other conditions linked to overall health, including  heart disease and diabetes.

## 2024-06-26 NOTE — Progress Notes (Signed)
 Please attest and cosign this visit due to patients primary care provider not being in the office at the time the visit was completed.     Chief Complaint  Patient presents with   Medicare Wellness     Subjective:   Melinda Tran is a 73 y.o. female who presents for a Medicare Annual Wellness Visit.  Allergies (verified) Dust mite mixed allergen ext [mite (d. farinae)], Mixed grasses, and Pollen extract-tree extract [pollen extract]   History: Past Medical History:  Diagnosis Date   Allergy    Anemia    in the past    Arthritis    Asthma    Cataract May, 2023   Removed August, 2023   GERD (gastroesophageal reflux disease)    Hyperlipidemia    Past Surgical History:  Procedure Laterality Date   ABDOMINAL HYSTERECTOMY  1997   COLONOSCOPY     EYE SURGERY     Cataracts removed   FRACTURE SURGERY     Broken foot, 2007   partial hysterectom     still with bil ovaries   POLYPECTOMY     TUBAL LIGATION  1987   Family History  Problem Relation Age of Onset   Colon cancer Mother    Lung cancer Father    Prostate cancer Father    Alcohol abuse Father    Drug abuse Brother        in recovery   Alcohol abuse Brother        in recovery   Stroke Maternal Grandmother    Heart attack Maternal Grandfather        early 60's   Testicular cancer Son    Rectal cancer Neg Hx    Stomach cancer Neg Hx    Social History   Occupational History   Occupation: retired since 2006  Tobacco Use   Smoking status: Former    Current packs/day: 0.00    Average packs/day: 0.3 packs/day for 5.0 years (1.3 ttl pk-yrs)    Types: Cigarettes    Start date: 72    Quit date: 1977    Years since quitting: 48.9   Smokeless tobacco: Never  Vaping Use   Vaping status: Never Used  Substance and Sexual Activity   Alcohol use: Yes    Alcohol/week: 7.0 standard drinks of alcohol    Types: 7 Standard drinks or equivalent per week    Comment: 1 glass of wine a day    Drug use: No   Sexual  activity: Yes    Partners: Male    Birth control/protection: Surgical   Tobacco Counseling Counseling given: Not Answered  SDOH Screenings   Food Insecurity: No Food Insecurity (06/26/2024)  Housing: Low Risk  (06/26/2024)  Transportation Needs: No Transportation Needs (06/26/2024)  Utilities: Not At Risk (06/26/2024)  Alcohol Screen: Low Risk  (06/26/2024)  Depression (PHQ2-9): Low Risk  (06/26/2024)  Financial Resource Strain: Low Risk  (06/26/2024)  Physical Activity: Insufficiently Active (06/26/2024)  Social Connections: Moderately Isolated (06/26/2024)  Stress: No Stress Concern Present (06/26/2024)  Tobacco Use: Medium Risk (06/26/2024)  Health Literacy: Adequate Health Literacy (06/26/2024)   See flowsheets for full screening details  Depression Screen PHQ 2 & 9 Depression Scale- Over the past 2 weeks, how often have you been bothered by any of the following problems? Little interest or pleasure in doing things: 0 Feeling down, depressed, or hopeless (PHQ Adolescent also includes...irritable): 0 PHQ-2 Total Score: 0 Trouble falling or staying asleep, or sleeping too much: 0  Feeling tired or having little energy: 1 Poor appetite or overeating (PHQ Adolescent also includes...weight loss): 0 Feeling bad about yourself - or that you are a failure or have let yourself or your family down: 0 Trouble concentrating on things, such as reading the newspaper or watching television (PHQ Adolescent also includes...like school work): 0 Moving or speaking so slowly that other people could have noticed. Or the opposite - being so fidgety or restless that you have been moving around a lot more than usual: 0 Thoughts that you would be better off dead, or of hurting yourself in some way: 0 PHQ-9 Total Score: 1 If you checked off any problems, how difficult have these problems made it for you to do your work, take care of things at home, or get along with other people?: Not difficult at  all  Depression Treatment Depression Interventions/Treatment : Counseling     Goals Addressed             This Visit's Progress    Weight (lb) < 200 lb (90.7 kg)   198 lb (89.8 kg)    I would like to lose 25lb       Visit info / Clinical Intake: Medicare Wellness Visit Type:: Subsequent Annual Wellness Visit Persons participating in visit:: patient Medicare Wellness Visit Mode:: Telephone If telephone:: video declined Because this visit was a virtual/telehealth visit:: unable to obtan vitals due to lack of equipment If Telephone or Video please confirm:: I discussed the limitations of evaluation and management by telemedicine Patient Location:: home Provider Location:: clinic Information given by:: patient Interpreter Needed?: No Pre-visit prep was completed: yes AWV questionnaire completed by patient prior to visit?: no Living arrangements:: lives with spouse/significant other Patient's Overall Health Status Rating: good Typical amount of pain: some Does pain affect daily life?: no Are you currently prescribed opioids?: no  Dietary Habits and Nutritional Risks How many meals a day?: 3 Eats fruit and vegetables daily?: yes Most meals are obtained by: preparing own meals In the last 2 weeks, have you had any of the following?: none Diabetic:: no  Functional Status Activities of Daily Living (to include ambulation/medication): Independent Ambulation: Independent Medication Administration: Independent Home Management: Independent Manage your own finances?: yes Primary transportation is: driving Concerns about vision?: no *vision screening is required for WTM* Concerns about hearing?: no  Fall Screening Falls in the past year?: 1 Number of falls in past year: 0 Was there an injury with Fall?: 0 Fall Risk Category Calculator: 1 Patient Fall Risk Level: Low Fall Risk  Fall Risk Patient at Risk for Falls Due to: No Fall Risks Fall risk Follow up: Education  provided; Falls prevention discussed  Home and Transportation Safety: All rugs have non-skid backing?: yes All stairs or steps have railings?: N/A, no stairs Grab bars in the bathtub or shower?: (!) no Have non-skid surface in bathtub or shower?: yes Good home lighting?: yes Regular seat belt use?: yes Hospital stays in the last year:: no  Cognitive Assessment Difficulty concentrating, remembering, or making decisions? : no Will 6CIT or Mini Cog be Completed: yes What year is it?: 0 points What month is it?: 0 points Give patient an address phrase to remember (5 components): [address] [place]  About what time is it?: 0 points Count backwards from 20 to 1: 0 points Say the months of the year in reverse: 0 points Repeat the address phrase from earlier: 0 points 6 CIT Score: 0 points  Advance Directives (For Healthcare)  Does Patient Have a Medical Advance Directive?: Yes Type of Advance Directive: Healthcare Power of [place]; Living will Copy of Healthcare Power of Attorney in Chart?: No - copy requested Copy of Living Will in Chart?: No - copy requested  Reviewed/Updated  Reviewed/Updated: Reviewed All (Medical, Surgical, Family, Medications, Allergies, Care Teams, Patient Goals)        Objective:    Today's Vitals   06/26/24 1302  Weight: 198 lb (89.8 kg)  Height: 5' 5 (1.651 m)   Body mass index is 32.95 kg/m.  Current Medications (verified) Outpatient Encounter Medications as of 06/26/2024  Medication Sig   albuterol  (PROVENTIL ) (2.5 MG/3ML) 0.083% nebulizer solution Take 3 mLs (2.5 mg total) by nebulization every 6 (six) hours as needed for wheezing or shortness of breath.   albuterol  (VENTOLIN  HFA) 108 (90 Base) MCG/ACT inhaler Inhale 1-2 puffs into the lungs every 6 (six) hours as needed for wheezing or shortness of breath.   atorvastatin  (LIPITOR) 80 MG tablet Take 1 tablet (80 mg total) by mouth daily.   calcium  carbonate 200 MG capsule Take 250  mg by mouth daily.   cholecalciferol (VITAMIN D ) 1000 UNITS tablet Take 1,000 Units by mouth daily.   EPIPEN 2-PAK 0.3 MG/0.3ML SOAJ injection See admin instructions.   esomeprazole (NEXIUM) 20 MG capsule Take 20 mg by mouth daily at 12 noon.   fluticasone  (FLONASE) 50 MCG/ACT nasal spray 1-2 sprays   Glucosamine-Chondroitin (GLUCOSAMINE CHONDR COMPLEX PO) Take 2 tablets by mouth daily.   levocetirizine (XYZAL ALLERGY 24HR) 5 MG tablet 1 tablet in the evening Orally Once a day   montelukast (SINGULAIR) 10 MG tablet Take 10 mg by mouth at bedtime.   Spacer/Aero-Holding Chambers (AEROCHAMBER PLUS FLO-VU MEDIUM) MISC See admin instructions.   TRELEGY ELLIPTA 200-62.5-25 MCG/ACT AEPB 1 puff.   Turmeric 500 MG CAPS Take by mouth daily.   vitamin C (ASCORBIC ACID) 500 MG tablet Take 500 mg by mouth daily.   alendronate  (FOSAMAX ) 70 MG tablet Take 1 tablet (70 mg total) by mouth every 7 (seven) days. Take with a full glass of water on an empty stomach.   predniSONE  (DELTASONE ) 20 MG tablet Take two tablets once daily for five days (Patient not taking: Reported on 06/26/2024)   [DISCONTINUED] cetirizine (ZYRTEC) 10 MG tablet Take 10 mg by mouth daily.   [DISCONTINUED] fluticasone -salmeterol (WIXELA INHUB) 500-50 MCG/ACT AEPB Inhale 1 puff into the lungs in the morning and at bedtime. (Patient not taking: Reported on 06/26/2024)   No facility-administered encounter medications on file as of 06/26/2024.   Hearing/Vision screen Vision Screening - Comments:: UTD w/visits to [facility] Immunizations and Health Maintenance Health Maintenance  Topic Date Due   Colonoscopy  09/24/2019   Mammogram  05/28/2023   COVID-19 Vaccine (13 - 2025-26 season) 04/07/2024   Medicare Annual Wellness (AWV)  06/25/2024   DTaP/Tdap/Td (2 - Td or Tdap) 07/16/2033   Pneumococcal Vaccine: 50+ Years  Completed   Influenza Vaccine  Completed   Bone Density Scan  Completed   Hepatitis C Screening  Completed   Zoster  Vaccines- Shingrix  Completed   Meningococcal B Vaccine  Aged Out        Assessment/Plan:  This is a routine wellness examination for [facility].  Patient Care Team: Corwin Antu, FNP as PCP - General (Family Medicine) Frutoso Luz, MD as Referring Physician (Allergy) [facility], [place]  I have personally reviewed and noted the following in the patient's chart:   Medical and  social history Use of alcohol, tobacco or illicit drugs  Current medications and supplements including opioid prescriptions. Functional ability and status Nutritional status Physical activity Advanced directives List of other physicians Hospitalizations, surgeries, and ER visits in previous 12 months Vitals Screenings to include cognitive, depression, and falls Referrals and appointments  No orders of the defined types were placed in this encounter. Pt declines referral for colonoscopy  In addition, I have reviewed and discussed with patient certain preventive protocols, quality metrics, and best practice recommendations. A written personalized care plan for preventive services as well as general preventive health recommendations were provided to patient.   Erminio LITTIE Saris, LPN   [id number]    After Visit Summary: (MyChart) Due to this being a telephonic visit, the after visit summary with patients personalized plan was offered to patient via MyChart   Nurse Notes: Pt has no concerns or questions. AWV/CPE made simultaneously for 2026

## 2024-06-27 ENCOUNTER — Other Ambulatory Visit: Payer: Self-pay | Admitting: Family

## 2024-06-27 DIAGNOSIS — J301 Allergic rhinitis due to pollen: Secondary | ICD-10-CM | POA: Diagnosis not present

## 2024-06-27 DIAGNOSIS — J3081 Allergic rhinitis due to animal (cat) (dog) hair and dander: Secondary | ICD-10-CM | POA: Diagnosis not present

## 2024-06-27 DIAGNOSIS — E782 Mixed hyperlipidemia: Secondary | ICD-10-CM

## 2024-06-27 DIAGNOSIS — J3089 Other allergic rhinitis: Secondary | ICD-10-CM | POA: Diagnosis not present

## 2024-07-02 DIAGNOSIS — Z961 Presence of intraocular lens: Secondary | ICD-10-CM | POA: Diagnosis not present

## 2024-07-02 DIAGNOSIS — H43813 Vitreous degeneration, bilateral: Secondary | ICD-10-CM | POA: Diagnosis not present

## 2024-08-20 ENCOUNTER — Encounter: Admitting: Family

## 2025-06-29 ENCOUNTER — Ambulatory Visit

## 2025-06-29 ENCOUNTER — Encounter: Admitting: Family
